# Patient Record
Sex: Female | Born: 1955 | Race: Black or African American | Hispanic: No | Marital: Married | Smoking: Never smoker
Health system: Southern US, Community
[De-identification: ages and names within clinical notes are randomized; demographics above are authoritative.]

## PROBLEM LIST (undated history)

## (undated) DIAGNOSIS — E039 Hypothyroidism, unspecified: Secondary | ICD-10-CM

## (undated) DIAGNOSIS — I10 Essential (primary) hypertension: Secondary | ICD-10-CM

## (undated) HISTORY — PX: TUBAL LIGATION: SHX77

## (undated) HISTORY — PX: ABDOMINAL HYSTERECTOMY: SHX81

---

## 1998-01-11 ENCOUNTER — Emergency Department (HOSPITAL_COMMUNITY): Admission: EM | Admit: 1998-01-11 | Discharge: 1998-01-11 | Payer: Self-pay | Admitting: Emergency Medicine

## 1998-01-12 ENCOUNTER — Encounter: Payer: Self-pay | Admitting: Emergency Medicine

## 2002-06-17 ENCOUNTER — Inpatient Hospital Stay (HOSPITAL_COMMUNITY): Admission: EM | Admit: 2002-06-17 | Discharge: 2002-06-21 | Payer: Self-pay | Admitting: Psychiatry

## 2009-07-13 ENCOUNTER — Ambulatory Visit (HOSPITAL_COMMUNITY): Admission: RE | Admit: 2009-07-13 | Discharge: 2009-07-13 | Payer: Self-pay | Admitting: Internal Medicine

## 2010-05-06 LAB — CBC
HCT: 37.7 % (ref 36.0–46.0)
Hemoglobin: 12.2 g/dL (ref 12.0–15.0)
MCHC: 32.4 g/dL (ref 30.0–36.0)
MCV: 78.1 fL (ref 78.0–100.0)
Platelets: 309 10*3/uL (ref 150–400)
RBC: 4.83 MIL/uL (ref 3.87–5.11)
RDW: 21.5 % — ABNORMAL HIGH (ref 11.5–15.5)
WBC: 4.4 10*3/uL (ref 4.0–10.5)

## 2010-05-06 LAB — BASIC METABOLIC PANEL
BUN: 8 mg/dL (ref 6–23)
CO2: 31 mEq/L (ref 19–32)
Calcium: 9.9 mg/dL (ref 8.4–10.5)
Chloride: 104 mEq/L (ref 96–112)
Creatinine, Ser: 0.91 mg/dL (ref 0.4–1.2)
GFR calc Af Amer: >60 mL/min (ref >60)
GFR calc non Af Amer: >60 mL/min (ref >60)
Glucose, Bld: 94 mg/dL (ref 70–99)
Potassium: 3.9 mEq/L (ref 3.5–5.1)
Sodium: 140 mEq/L (ref 135–145)

## 2010-05-21 ENCOUNTER — Other Ambulatory Visit: Payer: Self-pay | Admitting: Gynecology

## 2010-05-21 DIAGNOSIS — R928 Other abnormal and inconclusive findings on diagnostic imaging of breast: Secondary | ICD-10-CM

## 2010-06-03 ENCOUNTER — Ambulatory Visit
Admission: RE | Admit: 2010-06-03 | Discharge: 2010-06-03 | Disposition: A | Discharge status: Home / Self Care | Payer: 59 | Source: Ambulatory Visit | Attending: Gynecology | Admitting: Gynecology

## 2010-06-03 DIAGNOSIS — R928 Other abnormal and inconclusive findings on diagnostic imaging of breast: Secondary | ICD-10-CM

## 2010-06-27 ENCOUNTER — Encounter (HOSPITAL_COMMUNITY): Payer: 59

## 2010-06-27 ENCOUNTER — Other Ambulatory Visit (HOSPITAL_COMMUNITY): Payer: Self-pay | Admitting: Obstetrics and Gynecology

## 2010-06-27 LAB — BASIC METABOLIC PANEL
Calcium: 10.8 mg/dL — ABNORMAL HIGH (ref 8.4–10.5)
GFR calc Af Amer: >60 mL/min (ref >60)
GFR calc non Af Amer: >60 mL/min (ref >60)
Sodium: 135 mEq/L (ref 135–145)

## 2010-06-27 LAB — CBC
MCHC: 32.9 g/dL (ref 30.0–36.0)
RDW: 13.9 % (ref 11.5–15.5)

## 2010-06-27 LAB — SURGICAL PCR SCREEN: MRSA, PCR: NEGATIVE

## 2010-07-04 ENCOUNTER — Ambulatory Visit (HOSPITAL_COMMUNITY)
Admission: RE | Admit: 2010-07-04 | Discharge: 2010-07-05 | Disposition: A | Discharge status: Home / Self Care | Payer: 59 | Source: Ambulatory Visit | Attending: Obstetrics and Gynecology | Admitting: Obstetrics and Gynecology

## 2010-07-04 ENCOUNTER — Other Ambulatory Visit (HOSPITAL_COMMUNITY): Payer: Self-pay | Admitting: Obstetrics and Gynecology

## 2010-07-04 DIAGNOSIS — I10 Essential (primary) hypertension: Secondary | ICD-10-CM | POA: Insufficient documentation

## 2010-07-04 DIAGNOSIS — N8000 Endometriosis of the uterus, unspecified: Secondary | ICD-10-CM | POA: Insufficient documentation

## 2010-07-04 DIAGNOSIS — K7689 Other specified diseases of liver: Secondary | ICD-10-CM | POA: Insufficient documentation

## 2010-07-04 DIAGNOSIS — Z01818 Encounter for other preprocedural examination: Secondary | ICD-10-CM | POA: Insufficient documentation

## 2010-07-04 DIAGNOSIS — N838 Other noninflammatory disorders of ovary, fallopian tube and broad ligament: Secondary | ICD-10-CM | POA: Insufficient documentation

## 2010-07-04 DIAGNOSIS — N920 Excessive and frequent menstruation with regular cycle: Secondary | ICD-10-CM | POA: Insufficient documentation

## 2010-07-04 DIAGNOSIS — N840 Polyp of corpus uteri: Secondary | ICD-10-CM | POA: Insufficient documentation

## 2010-07-04 DIAGNOSIS — D251 Intramural leiomyoma of uterus: Secondary | ICD-10-CM | POA: Insufficient documentation

## 2010-07-04 DIAGNOSIS — Z01812 Encounter for preprocedural laboratory examination: Secondary | ICD-10-CM | POA: Insufficient documentation

## 2010-07-04 DIAGNOSIS — E039 Hypothyroidism, unspecified: Secondary | ICD-10-CM | POA: Insufficient documentation

## 2010-07-04 LAB — TYPE AND SCREEN: ABO/RH(D): O POS

## 2010-07-05 LAB — CBC
HCT: 31.6 % — ABNORMAL LOW (ref 36.0–46.0)
RDW: 13.8 % (ref 11.5–15.5)
WBC: 10.7 10*3/uL — ABNORMAL HIGH (ref 4.0–10.5)

## 2010-07-05 NOTE — Discharge Summary (Signed)
NAME:  ANNER, [name] NO.:  [id number]   MEDICAL RECORD NO.:  [id number]                   PATIENT TYPE:  IPS   LOCATION:  0404                                 FACILITY[facility]  [facility]   PHYSICIAN:  Jeanice Lim, M.D.              DATE OF BIRTH:  1955/12/17   DATE OF ADMISSION:  06/17/2002  DATE OF DISCHARGE:  06/21/2002                                 DISCHARGE SUMMARY   IDENTIFYING DATA:  This is a 55 year old African-American female currently  separated from her husband of 20 years, complaining of depression, hearing  voices, and having suicidal ideations since her husband left two weeks ago.   ALLERGIES:  No known drug allergies.   PHYSICAL EXAMINATION:  Essentially within normal limits.  Neurologically  nonfocal.   ROUTINE ADMISSION LABS:  Essentially within normal limits.   MENTAL STATUS EXAM:  The patient was religiously preoccupied, uncooperative  initially, neatly dressed and groomed, brief answers, somewhat guarded,  irritable, and depressed, clearly suspicious.  Thought process mostly goal  directed.  Thought content difficult to evaluate.  Patient not fully  cooperative due to paranoia and possible thought blocking, and responding to  internal stimulus.  Cognition was grossly intact.  Judgment and insight were  fair to poor.   ADMITTING DIAGNOSES:   AXIS I:  Schizophrenia, undifferentiated type.   AXIS II:  None.   AXIS III:  Hypertension.   AXIS IV:  Severe problems with primary support group.   AXIS V:  30/70.   The patient was admitted with routine p.r.n. medications, underwent further  monitoring, was encouraged to participate in individual, group, and milieu  therapy.  The patient was resumed on medical medications and psychotropics,  and given Geodon I.M. for acute agitation and Geodon was then optimized as  well as Haldol deck dose was verified.  The patient was given K-Dur to  replace potassium and started on Zoloft for  residual depressive symptoms  versus negative symptoms.  The patient reported a positive response to  clinical intervention and her condition at discharge is improved.  Mood was  less depressed, affect brighter, thought process goal directed, thought  content negative for dangerous ideation.  The patient no longer was hearing  voices and reported motivation to be compliant with the aftercare plan.  The  patient was discharged on Haldol 2 mg q.h.s., hydrochlorothiazide 25 mg  q.a.m., Ativan 0.25 mg b.i.d., Geodon 80 mg b.i.d., K-Dur 40 mEq b.i.d. and  Zoloft 25 mg q.a.m.  The patient was discharged to follow up with Dr. Lang Snow  Thursday, May 6th, at 11 a.m. at [facility].    DISCHARGE DIAGNOSES:   AXIS I:  Schizophrenia, undifferentiated type.   AXIS II:  None.   AXIS III:  Hypertension.   AXIS IV:  Severe problems with primary support group.   AXIS V:  55.  Jeanice Lim, M.D.    JEM/MEDQ  D:  07/11/2002  T:  07/11/2002  Job:  [id number]

## 2010-07-05 NOTE — H&P (Signed)
NAME:  Yvette Walker, Yvette Walker                              ACCOUNT NO.:  [id number]   MEDICAL RECORD NO.:  [id number]                   PATIENT TYPE:  IPS   LOCATION:  0404                                 [facility]ACILITY[facility]   PHYSICIAN:  Jeanice Lim, M.D.              DATE OF BIRTH:  03-Nov-1955   DATE OF ADMISSION:  06/17/2002  DATE OF DISCHARGE:                         PSYCHIATRIC ADMISSION ASSESSMENT   IDENTIFYING INFORMATION:  She is here on a voluntary basis.  Identifying  information is the patient and the records.  This  is a 55 year old African-  American female who is currently separating from her husband of 20 years.   REASON FOR ADMISSION AND SYMPTOMS:  This patient presented at [facility] yesterday complaining of depression, decreased sleep,  hearing whispers, and having suicidal ideation since her husband left 2  weeks ago.  She wanted to be admitted because she wanted to rest.   PAST PSYCHIATRIC HISTORY:  The patient has had 1 prior psychiatric admission  10 or 12 years ago at [facility].  She at that time was  responding to internal stimuli.  She felt that there was a person inside of  her that had to be cut out.  She has been maintained for years on Haldol  Decanoate, 50 mg per cc, 0.2 cc IM q.4 weeks.  Her last injection was April  4.   SOCIAL HISTORY:  She has been married for 20 years.  She has 1 daughter.  She cannot tell me how old her daughter is at this time and she reports that  her husband separated about 2 weeks ago from her.   FAMILY HISTORY:  She denies any schizophrenia.   ALCOHOL AND DRUG ABUSE:  She states she has no use of either alcohol or  tobacco.   PAST MEDICAL HISTORY:  She cannot tell me who her primary care Ilse Billman is,  however she is known to take hydrochlorothiazide for hypertension, 24 mg  p.o. daily.   ALLERGIES:  No known drug allergies.   POSITIVE PHYSICAL FINDINGS:  Her physical examination was within  normal  limits.  Her labs were not available at this time.  Her review of systems  was negative.   MENTAL STATUS EXAM:  Initially, the patient was too psychotic.  She was  religiously preoccupied.  On return visit the patient was noted to be neatly  groomed and dressed.  She has a normal weight.  She answers briefly but  appropriately to questioning.  She is not initiating conversation yet.  Her  mood is irritable.  She is depressed.  She is somewhat suspicious.  Her  thought processes are clear at the second visit.  She was alert and  oriented, however her memory was decreased.  Her concentration was  decreased.  Her judgment and insight were decreased.  She was thought  blocking and still  responding to internal stimuli.  Her cognition is  average.   ADMISSION DIAGNOSES:   AXIS I:  Schizophrenia, undifferentiated.   AXIS II:  None.   AXIS III:  Hypertension.   AXIS IV:  Severe.  Problems with primary support group.   AXIS V:  Global assessment of function is currently 30, in the past year  probably 70.   PLAN:  Admit to stabilize.  To get her back to her preadmission status, and  we will coordinate with the family to provide for the support during stay.       Vic Ripper, P.A.-C.               Jeanice Lim, M.D.    MD/MEDQ  D:  06/18/2002  T:  06/18/2002  Job:  [id number]

## 2010-07-12 NOTE — Op Note (Signed)
NAME:  Yvette Walker, Yvette Walker                  ACCOUNT NO.:  [id number]  MEDICAL RECORD NO.:  [id number]           PATIENT TYPE:  O  LOCATION:  9318                          FACILITY:  [facility]  PHYSICIAN:  Zelphia Cairo, MD    DATE OF BIRTH:  1955/03/29  DATE OF PROCEDURE:  07/04/2010 DATE OF DISCHARGE:                              OPERATIVE REPORT   PREOPERATIVE DIAGNOSES: 1. Endometrial mass. 2. History of endometrial hyperplasia.  POSTOPERATIVE DIAGNOSES: 1. Endometrial mass. 2. History of endometrial hyperplasia. 3. Liver lesion.  PROCEDURE: 1. Laparoscopic-assisted vaginal hysterectomy. 2. Bilateral salpingo-oophorectomy.  SURGEON:  Zelphia Cairo, MD.  ASSISTANTMarcelle Overlie.  BLOOD LOSS:  250 mL.  URINE OUTPUT:  300 mL, clear.  SPECIMEN:  Uterus, cervix, bilateral fallopian tubes, and ovaries.  COMPLICATIONS:  None.  CONDITION:  Stable and extubated to recovery room.  DESCRIPTION OF PROCEDURE:  [name] was taken to the operating room.  After informed consent was obtained, she was given general anesthesia and placed in the dorsal lithotomy position using Allen stirrups.  She was prepped and draped in sterile fashion and a Foley catheter was inserted. Bivalve speculum was placed in the vagina and a single-tooth tenaculum placed on the anterior lip of the cervix.  A Hulka clamp was placed on the cervix.  Single-tooth tenaculum and speculum were removed and our attention was turned to the abdomen.  A 0.25% Marcaine was used to provide local anesthesia at the site of our skin incision, a small infraumbilical skin incision was made using a prior incision from her tubal ligation.  This incision was extended bluntly to the level of the fascia using a Kelly clamp and the optical trocar was inserted under direct visualization.  Once intraperitoneal placement was confirmed, CO2 was turned on and the abdomen and pelvis were insufflated.  A survey of the abdomen and pelvis revealed an  enlarged uterus.  Bilateral ovaries appeared to have a discolored yellow appearance almost as if they were replaced by fatty tissue.  No excrescences were noted.  Fallopian tubes were consistent with prior tubal ligation.  She also had a small lesion on the right lobe of her liver.  No other intra-abdominal abnormalities were noted.  First, a suprapubic incision was made and a 5-mm trocar was inserted under direct visualization.  Atraumatic graspers were inserted through this trocar.  The uterus was manipulated to the patient's left side.  The right infundibulopelvic ligament was cauterized and cut using the instill device.  This incision was continued down the mesosalpinx just adjacent to the ovary and fallopian tube to the level of the round ligament.  The round ligament was then cauterized and cut using the instill device. This procedure was repeated on the opposite side.  Hemostasis was assured bilaterally and our attention was turned to the vagina.  All instruments were removed from the trocars in the abdomen.  CO2 was turned off. The Hulka clamp was removed from the cervix and a weighted speculum was placed in the vagina.  A Beaver was placed anteriorly and the cervix was grasped with a toothed tenaculum.  A circumferential incision  was made around the cervix using the Bovie.  The posterior cul-de-sac was entered sharply using curved Mayo scissors.  A weighted speculum was placed in the posterior cul-de-sac.  Bilateral uterosacral ligaments were clamped with Heaney clamps, transected and suture ligated.  Hemostasis was assured.  The anterior cul-de-sac was then entered sharply and a Christain Sacramento was placed anteriorly.  Cardinal ligaments were then clamped bilaterally, transected and suture ligated.  Uterine arteries and broad ligament were then serially clamped and with Heaney clamps, transected and suture ligated.  Once hemostasis was visualized, the uterus, bilateral tubes and ovaries  were delivered through the posterior cul-de- sac.  The cornua and remaining pedicles were clamped with Heaney clamps and transected.  These pedicles were suture ligated with excellent hemostasis.  Specimen was passed off to be sent to pathology.  The posterior vaginal cuff was then reapproximated to the peritoneum using a long running stitch of Monocryl.  Vaginal cuff angles were then closed with figure-of-eight stitches of #0 Vicryl.  The remainder of the vaginal cuff was then closed with figure-of-eight stitches of #0 Vicryl in an interrupted fashion.  All instruments were then removed from the vagina and our attention was returned to the abdomen.  The abdomen was reinsufflated.  All pedicles were inspected and found to be hemostatic. Vaginal cuff was inspected and found to be hemostatic.  All instruments and trocar were then removed from the abdomen.  A deep stitch was placed in the infraumbilical skin incision.  The skin was reapproximated using Vicryl and Dermabond was placed over the skin incisions.  The patient tolerated the procedure well.  Sponge lap, needle, and instrument counts were correct x2.  She was taken to the recovery room in stable condition.     Zelphia Cairo, MD     GA/MEDQ  D:  07/04/2010  T:  07/04/2010  Job:  [id number]  Electronically Signed by Zelphia Cairo MD on 07/12/2010 11:11:30 AM

## 2011-06-11 ENCOUNTER — Other Ambulatory Visit: Payer: Self-pay | Admitting: Gynecology

## 2011-06-11 DIAGNOSIS — R928 Other abnormal and inconclusive findings on diagnostic imaging of breast: Secondary | ICD-10-CM

## 2014-06-20 ENCOUNTER — Other Ambulatory Visit: Payer: Self-pay | Admitting: Gynecology

## 2014-06-22 LAB — CYTOLOGY - PAP

## 2016-07-20 DIAGNOSIS — F209 Schizophrenia, unspecified: Secondary | ICD-10-CM | POA: Insufficient documentation

## 2016-07-21 DIAGNOSIS — I10 Essential (primary) hypertension: Secondary | ICD-10-CM | POA: Insufficient documentation

## 2016-07-21 DIAGNOSIS — R7303 Prediabetes: Secondary | ICD-10-CM | POA: Insufficient documentation

## 2016-07-21 DIAGNOSIS — E039 Hypothyroidism, unspecified: Secondary | ICD-10-CM | POA: Insufficient documentation

## 2016-07-28 DIAGNOSIS — R9431 Abnormal electrocardiogram [ECG] [EKG]: Secondary | ICD-10-CM | POA: Insufficient documentation

## 2017-10-12 DIAGNOSIS — R000 Tachycardia, unspecified: Secondary | ICD-10-CM | POA: Diagnosis not present

## 2017-10-12 DIAGNOSIS — I10 Essential (primary) hypertension: Secondary | ICD-10-CM | POA: Diagnosis not present

## 2017-10-12 DIAGNOSIS — Z1231 Encounter for screening mammogram for malignant neoplasm of breast: Secondary | ICD-10-CM | POA: Diagnosis not present

## 2018-04-19 DIAGNOSIS — Z1231 Encounter for screening mammogram for malignant neoplasm of breast: Secondary | ICD-10-CM | POA: Diagnosis not present

## 2018-04-19 DIAGNOSIS — M8580 Other specified disorders of bone density and structure, unspecified site: Secondary | ICD-10-CM | POA: Diagnosis not present

## 2018-04-19 DIAGNOSIS — Z23 Encounter for immunization: Secondary | ICD-10-CM | POA: Diagnosis not present

## 2018-04-19 DIAGNOSIS — R000 Tachycardia, unspecified: Secondary | ICD-10-CM | POA: Diagnosis not present

## 2018-04-19 DIAGNOSIS — I10 Essential (primary) hypertension: Secondary | ICD-10-CM | POA: Diagnosis not present

## 2018-09-29 DIAGNOSIS — I10 Essential (primary) hypertension: Secondary | ICD-10-CM | POA: Diagnosis not present

## 2018-09-29 DIAGNOSIS — L820 Inflamed seborrheic keratosis: Secondary | ICD-10-CM | POA: Diagnosis not present

## 2018-09-29 DIAGNOSIS — R000 Tachycardia, unspecified: Secondary | ICD-10-CM | POA: Diagnosis not present

## 2018-09-29 DIAGNOSIS — G250 Essential tremor: Secondary | ICD-10-CM | POA: Diagnosis not present

## 2018-11-01 DIAGNOSIS — M8580 Other specified disorders of bone density and structure, unspecified site: Secondary | ICD-10-CM | POA: Diagnosis not present

## 2018-11-01 DIAGNOSIS — Z23 Encounter for immunization: Secondary | ICD-10-CM | POA: Diagnosis not present

## 2018-11-01 DIAGNOSIS — I10 Essential (primary) hypertension: Secondary | ICD-10-CM | POA: Diagnosis not present

## 2018-11-01 DIAGNOSIS — Z1231 Encounter for screening mammogram for malignant neoplasm of breast: Secondary | ICD-10-CM | POA: Diagnosis not present

## 2018-11-01 DIAGNOSIS — R000 Tachycardia, unspecified: Secondary | ICD-10-CM | POA: Diagnosis not present

## 2019-04-01 ENCOUNTER — Other Ambulatory Visit: Payer: Self-pay | Admitting: Physician Assistant

## 2019-04-01 DIAGNOSIS — M545 Low back pain: Secondary | ICD-10-CM | POA: Diagnosis not present

## 2019-04-01 DIAGNOSIS — Z1231 Encounter for screening mammogram for malignant neoplasm of breast: Secondary | ICD-10-CM

## 2019-04-01 DIAGNOSIS — G250 Essential tremor: Secondary | ICD-10-CM | POA: Diagnosis not present

## 2019-04-01 DIAGNOSIS — I10 Essential (primary) hypertension: Secondary | ICD-10-CM | POA: Diagnosis not present

## 2019-04-01 DIAGNOSIS — Z6824 Body mass index (BMI) 24.0-24.9, adult: Secondary | ICD-10-CM | POA: Diagnosis not present

## 2019-08-15 DIAGNOSIS — Z6823 Body mass index (BMI) 23.0-23.9, adult: Secondary | ICD-10-CM | POA: Diagnosis not present

## 2019-08-15 DIAGNOSIS — K625 Hemorrhage of anus and rectum: Secondary | ICD-10-CM | POA: Diagnosis not present

## 2019-08-15 DIAGNOSIS — R110 Nausea: Secondary | ICD-10-CM | POA: Diagnosis not present

## 2019-09-29 DIAGNOSIS — M8580 Other specified disorders of bone density and structure, unspecified site: Secondary | ICD-10-CM | POA: Diagnosis not present

## 2019-09-29 DIAGNOSIS — I10 Essential (primary) hypertension: Secondary | ICD-10-CM | POA: Diagnosis not present

## 2019-09-29 DIAGNOSIS — K625 Hemorrhage of anus and rectum: Secondary | ICD-10-CM | POA: Diagnosis not present

## 2019-09-29 DIAGNOSIS — Z1231 Encounter for screening mammogram for malignant neoplasm of breast: Secondary | ICD-10-CM | POA: Diagnosis not present

## 2019-09-30 ENCOUNTER — Other Ambulatory Visit: Payer: Self-pay | Admitting: Physician Assistant

## 2019-10-03 ENCOUNTER — Other Ambulatory Visit: Payer: Self-pay | Admitting: Physician Assistant

## 2019-10-03 DIAGNOSIS — M8580 Other specified disorders of bone density and structure, unspecified site: Secondary | ICD-10-CM

## 2019-11-08 DIAGNOSIS — K5900 Constipation, unspecified: Secondary | ICD-10-CM | POA: Diagnosis not present

## 2019-11-08 DIAGNOSIS — Z86010 Personal history of colonic polyps: Secondary | ICD-10-CM | POA: Diagnosis not present

## 2019-11-08 DIAGNOSIS — K625 Hemorrhage of anus and rectum: Secondary | ICD-10-CM | POA: Diagnosis not present

## 2019-12-27 DIAGNOSIS — Z1211 Encounter for screening for malignant neoplasm of colon: Secondary | ICD-10-CM | POA: Diagnosis not present

## 2019-12-27 DIAGNOSIS — Z86010 Personal history of colonic polyps: Secondary | ICD-10-CM | POA: Diagnosis not present

## 2019-12-27 DIAGNOSIS — K625 Hemorrhage of anus and rectum: Secondary | ICD-10-CM | POA: Diagnosis not present

## 2019-12-27 DIAGNOSIS — K5900 Constipation, unspecified: Secondary | ICD-10-CM | POA: Diagnosis not present

## 2020-04-05 ENCOUNTER — Other Ambulatory Visit: Payer: Self-pay | Admitting: Physician Assistant

## 2020-04-05 DIAGNOSIS — M8580 Other specified disorders of bone density and structure, unspecified site: Secondary | ICD-10-CM

## 2020-04-06 ENCOUNTER — Other Ambulatory Visit: Payer: Self-pay | Admitting: Physician Assistant

## 2020-04-06 DIAGNOSIS — Z1231 Encounter for screening mammogram for malignant neoplasm of breast: Secondary | ICD-10-CM

## 2020-09-11 ENCOUNTER — Other Ambulatory Visit: Payer: Self-pay

## 2020-09-11 ENCOUNTER — Ambulatory Visit
Admission: RE | Admit: 2020-09-11 | Discharge: 2020-09-11 | Disposition: A | Discharge status: Home / Self Care | Payer: Medicare Other | Source: Ambulatory Visit | Attending: Physician Assistant | Admitting: Physician Assistant

## 2020-09-11 DIAGNOSIS — M8580 Other specified disorders of bone density and structure, unspecified site: Secondary | ICD-10-CM

## 2020-09-11 DIAGNOSIS — Z1231 Encounter for screening mammogram for malignant neoplasm of breast: Secondary | ICD-10-CM

## 2020-09-13 ENCOUNTER — Other Ambulatory Visit: Payer: Self-pay | Admitting: Physician Assistant

## 2020-09-13 DIAGNOSIS — R928 Other abnormal and inconclusive findings on diagnostic imaging of breast: Secondary | ICD-10-CM

## 2020-11-28 ENCOUNTER — Ambulatory Visit
Admission: RE | Admit: 2020-11-28 | Discharge: 2020-11-28 | Disposition: A | Discharge status: Home / Self Care | Payer: Medicare Other | Source: Ambulatory Visit | Attending: Physician Assistant | Admitting: Physician Assistant

## 2020-11-28 ENCOUNTER — Other Ambulatory Visit: Payer: Self-pay | Admitting: Physician Assistant

## 2020-11-28 ENCOUNTER — Other Ambulatory Visit: Payer: Self-pay

## 2020-11-28 DIAGNOSIS — R928 Other abnormal and inconclusive findings on diagnostic imaging of breast: Secondary | ICD-10-CM

## 2020-11-30 ENCOUNTER — Ambulatory Visit
Admission: RE | Admit: 2020-11-30 | Discharge: 2020-11-30 | Disposition: A | Discharge status: Home / Self Care | Payer: Medicare Other | Source: Ambulatory Visit | Attending: Physician Assistant | Admitting: Physician Assistant

## 2020-11-30 ENCOUNTER — Other Ambulatory Visit: Payer: Self-pay

## 2020-11-30 DIAGNOSIS — C50919 Malignant neoplasm of unspecified site of unspecified female breast: Secondary | ICD-10-CM

## 2020-11-30 DIAGNOSIS — R928 Other abnormal and inconclusive findings on diagnostic imaging of breast: Secondary | ICD-10-CM

## 2020-11-30 HISTORY — DX: Malignant neoplasm of unspecified site of unspecified female breast: C50.919

## 2020-12-05 ENCOUNTER — Ambulatory Visit: Payer: Self-pay | Admitting: General Surgery

## 2020-12-05 DIAGNOSIS — Z170 Estrogen receptor positive status [ER+]: Secondary | ICD-10-CM

## 2020-12-05 DIAGNOSIS — C50212 Malignant neoplasm of upper-inner quadrant of left female breast: Secondary | ICD-10-CM

## 2020-12-06 ENCOUNTER — Other Ambulatory Visit: Payer: Self-pay | Admitting: General Surgery

## 2020-12-06 DIAGNOSIS — Z170 Estrogen receptor positive status [ER+]: Secondary | ICD-10-CM

## 2020-12-10 NOTE — Progress Notes (Signed)
New Breast Cancer Diagnosis: Left Breast UIQ  Did patient present with symptoms (if so, please note symptoms) or screening mammography?:Screening Mass    Location and Extent of disease :left breast. Located in the upper inner quadrant, measured  9 mm in greatest dimension. Adenopathy no.  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 Marlou Starks - I have discussed with her in detail the different options for treatment and at this point she favors breast conservation which I feel is very reasonable. -She will be a good candidate for sentinel node biopsy as well. -I will go ahead and refer her to medical and radiation oncology to discuss adjuvant therapy. -I will also refer her to physical therapy for preoperative lymphedema testing. -Left Breast lumpectomy with radioactive seed and SLN biopsy 01/23/2021   Medical oncologist, treatment if any:    Family History of Breast/Ovarian/Prostate Cancer: N/a  Lymphedema issues, if any: No     Pain issues, if any: has some mild tenderness  SAFETY ISSUES: Prior radiation? No Pacemaker/ICD? No Possible current pregnancy? Postmenopausal Is the patient on methotrexate? No  Current Complaints / other details:

## 2020-12-11 ENCOUNTER — Ambulatory Visit
Admission: RE | Admit: 2020-12-11 | Discharge: 2020-12-11 | Disposition: A | Discharge status: Home / Self Care | Payer: Medicare Other | Source: Ambulatory Visit | Attending: Radiation Oncology | Admitting: Radiation Oncology

## 2020-12-11 ENCOUNTER — Other Ambulatory Visit: Payer: Self-pay

## 2020-12-11 ENCOUNTER — Encounter: Payer: Self-pay | Admitting: Radiation Oncology

## 2020-12-11 DIAGNOSIS — Z170 Estrogen receptor positive status [ER+]: Secondary | ICD-10-CM

## 2020-12-11 DIAGNOSIS — C50212 Malignant neoplasm of upper-inner quadrant of left female breast: Secondary | ICD-10-CM | POA: Insufficient documentation

## 2020-12-11 NOTE — Progress Notes (Signed)
Radiation Oncology         [phone]) [phone] ________________________________  Initial Outpatient Consultation - Conducted via telephone due to current COVID-19 concerns for limiting patient exposure  I spoke with the patient to conduct this consult visit via telephone to spare the patient unnecessary potential exposure in the healthcare setting during the current COVID-19 pandemic. The patient was notified in advance and was offered a [place] meeting to allow for face to face communication but unfortunately reported that they did not have the appropriate resources/technology to support such a visit and instead preferred to proceed with a telephone consult.    Name: Yvette Walker        MRN: [id number]  Date of Service: 12/11/2020 DOB: [birth year]  BE:[id number], Salli Quarry, MD     REFERRING PHYSICIAN: Autumn Messing III, MD   DIAGNOSIS: The encounter diagnosis was Malignant neoplasm of upper-inner quadrant of left breast in female, estrogen receptor positive ([place]).   HISTORY OF PRESENT ILLNESS: Yvette Walker is a 65 y.o. female seen at the request of Dr. Marlou Starks for a new diagnosis of left breast cancer.  The patient was found to have screening detected mass in the left breast.  The right breast did not show any abnormalities.  She underwent further diagnostic imaging which revealed a mass in the posterior left breast at the 11 o'clock position measuring 9 mm in greatest dimension the axilla was negative for adenopathy.  A biopsy on 11/30/2020 revealed a grade 2 invasive ductal carcinoma that was ER/PR positive, HER2 was negative with a Ki-67 of 5%.  She plans to undergo left lumpectomy with sentinel lymph node biopsy on 01/23/21 and needs to meet with medical oncology.   PREVIOUS RADIATION THERAPY: No   PAST MEDICAL HISTORY:  Past Medical History:  Diagnosis Date   Breast cancer ([place]) 11/30/2020       PAST SURGICAL HISTORY:History reviewed. No pertinent surgical history.   FAMILY  HISTORY:  Family History  Problem Relation Age of Onset   Cervical cancer Sister      SOCIAL HISTORY:  reports that she has never smoked. She has never used smokeless tobacco. She reports that she does not drink alcohol. The patient is married and lives in [place], [place]. She's retired from working in [employer] setting.    ALLERGIES: Patient has no allergy information on record.   MEDICATIONS:  Current Outpatient Medications  Medication Sig Dispense Refill   alendronate (FOSAMAX) 70 MG tablet Take 70 mg by mouth once a week.     amLODipine (NORVASC) 10 MG tablet daily.     haloperidol (HALDOL) 20 MG tablet Take by mouth.     levothyroxine (SYNTHROID) 25 MCG tablet Take by mouth.     metoprolol succinate (TOPROL-XL) 25 MG 24 hr tablet Take 25 mg by mouth daily.     trihexyphenidyl (ARTANE) 2 MG tablet Take 2 mg by mouth daily as needed.     No current facility-administered medications for this encounter.     REVIEW OF SYSTEMS: On review of systems, the patient reports that she is doing well overall. She denies any concerns with her breast at this time. No other complaints are noted.     PHYSICAL EXAM:  Unable to assess due to encounter type.      ECOG = 0  0 - Asymptomatic (Fully active, able to carry on all predisease activities without restriction)  1 - Symptomatic but completely ambulatory (Restricted in physically strenuous activity but  ambulatory and able to carry out work of a light or sedentary nature. For example, light housework, office work)  2 - Symptomatic, <50% in bed during the day (Ambulatory and capable of all self care but unable to carry out any work activities. Up and about more than 50% of waking hours)  3 - Symptomatic, >50% in bed, but not bedbound (Capable of only limited self-care, confined to bed or chair 50% or more of waking hours)  4 - Bedbound (Completely disabled. Cannot carry on any self-care. Totally confined to bed or chair)  5 - Death    Eustace Pen MM, Creech RH, Tormey DC, et al. [phone]). "Toxicity and response criteria of the [facility]". [place] Oncol. 5 (6): 649-55    LABORATORY DATA:  Lab Results  Component Value Date   WBC 10.7 (H) 07/05/2010   HGB 10.1 (L) 07/05/2010   HCT 31.6 (L) 07/05/2010   MCV 80.4 07/05/2010   PLT 278 07/05/2010   Lab Results  Component Value Date   NA 135 06/27/2010   K 3.8 06/27/2010   CL 95 (L) 06/27/2010   CO2 29 06/27/2010   No results found for: ALT, AST, GGT, ALKPHOS, BILITOT    RADIOGRAPHY: US BREAST LTD UNI LEFT INC AXILLA  Result Date: 11/28/2020 CLINICAL DATA:  Recall from screening mammography, possible mass involving the UPPER INNER QUADRANT of the LEFT breast at posterior depth. EXAM: DIGITAL DIAGNOSTIC UNILATERAL LEFT MAMMOGRAM WITH TOMOSYNTHESIS AND CAD; ULTRASOUND LEFT BREAST LIMITED TECHNIQUE: Left digital diagnostic mammography and breast tomosynthesis was performed. The images were evaluated with computer-aided detection.; Targeted ultrasound examination of the left breast was performed. COMPARISON:  Previous exams, including the most recent screening mammogram 09/11/2020. No prior ultrasound. ACR Breast Density Category c: The breast tissue is heterogeneously dense, which may obscure small masses. FINDINGS: Full field CC, MLO and mediolateral views and spot compression CC, MLO and mediolateral views of the area of concern obtained. Due to the far posterior location of the mass, none of the full field or spot compression images include the entire mass. The mass was not visible on the current full field CC view, but was likely partially imaged on the 09/11/2020 mammogram and is located the inner breast. The mass is more superiorly located on mediolateral view than on the MLO view, confirming its location in the inner breast, therefore [place]. There is associated architectural distortion. Targeted ultrasound is performed, demonstrating an  irregular hypoechoic mass with vague and angular margins at the 11 o'clock position 6 cm from nipple at posterior depth, measuring approximately 9 x 5 x 8 mm, demonstrating mixed posterior characteristics and no internal power Doppler flow, corresponding to the screening mammographic finding. Sonographic evaluation of the LEFT axilla demonstrates no pathologic lymphadenopathy. IMPRESSION: 1. Highly suspicious 9 mm mass involving the UPPER INNER QUADRANT of the LEFT breast at the 11 o'clock position 6 cm from the nipple. 2. No pathologic LEFT axillary lymphadenopathy. RECOMMENDATION: Ultrasound-guided core needle biopsy of the LEFT breast mass. The ultrasound core needle biopsy procedure was discussed with the patient and her questions were answered. She wishes to proceed with the biopsy which has been scheduled by the [facility] staff. I have discussed the findings and recommendations with the patient. BI-RADS CATEGORY  5: Highly suggestive of malignancy. Electronically Signed   By: Evangeline Dakin M.D.   On: 11/28/2020 10:55  MM DIAG BREAST TOMO UNI LEFT  Result Date: 11/28/2020 CLINICAL DATA:  Recall from screening  mammography, possible mass involving the UPPER INNER QUADRANT of the LEFT breast at posterior depth. EXAM: DIGITAL DIAGNOSTIC UNILATERAL LEFT MAMMOGRAM WITH TOMOSYNTHESIS AND CAD; ULTRASOUND LEFT BREAST LIMITED TECHNIQUE: Left digital diagnostic mammography and breast tomosynthesis was performed. The images were evaluated with computer-aided detection.; Targeted ultrasound examination of the left breast was performed. COMPARISON:  Previous exams, including the most recent screening mammogram 09/11/2020. No prior ultrasound. ACR Breast Density Category c: The breast tissue is heterogeneously dense, which may obscure small masses. FINDINGS: Full field CC, MLO and mediolateral views and spot compression CC, MLO and mediolateral views of the area of concern obtained. Due to the far posterior  location of the mass, none of the full field or spot compression images include the entire mass. The mass was not visible on the current full field CC view, but was likely partially imaged on the 09/11/2020 mammogram and is located the inner breast. The mass is more superiorly located on mediolateral view than on the MLO view, confirming its location in the inner breast, therefore [place]. There is associated architectural distortion. Targeted ultrasound is performed, demonstrating an irregular hypoechoic mass with vague and angular margins at the 11 o'clock position 6 cm from nipple at posterior depth, measuring approximately 9 x 5 x 8 mm, demonstrating mixed posterior characteristics and no internal power Doppler flow, corresponding to the screening mammographic finding. Sonographic evaluation of the LEFT axilla demonstrates no pathologic lymphadenopathy. IMPRESSION: 1. Highly suspicious 9 mm mass involving the UPPER INNER QUADRANT of the LEFT breast at the 11 o'clock position 6 cm from the nipple. 2. No pathologic LEFT axillary lymphadenopathy. RECOMMENDATION: Ultrasound-guided core needle biopsy of the LEFT breast mass. The ultrasound core needle biopsy procedure was discussed with the patient and her questions were answered. She wishes to proceed with the biopsy which has been scheduled by the [facility] staff. I have discussed the findings and recommendations with the patient. BI-RADS CATEGORY  5: Highly suggestive of malignancy. Electronically Signed   By: Evangeline Dakin M.D.   On: 11/28/2020 10:55  MM CLIP PLACEMENT LEFT  Result Date: 11/30/2020 CLINICAL DATA:  Patient with suspicious left breast mass status post biopsy. EXAM: 3D DIAGNOSTIC LEFT MAMMOGRAM POST ULTRASOUND BIOPSY COMPARISON:  Previous exam(s). FINDINGS: 3D Mammographic images were obtained following ultrasound guided biopsy of left breast mass 11 o'clock position. The biopsy marking clip is in expected position at the  site of biopsy. IMPRESSION: Appropriate positioning of the ribbon shaped biopsy marking clip at the site of biopsy in the left breast 11 o'clock position. Final Assessment: Post Procedure Mammograms for Marker Placement Electronically Signed   By: Lovey Newcomer M.D.   On: 11/30/2020 12:11  [place] LT BREAST BX W LOC DEV 1ST LESION IMG BX SPEC US GUIDE  Addendum Date: 12/04/2020   ADDENDUM REPORT: 12/04/2020 08:04 ADDENDUM: Pathology revealed GRADE II INVASIVE MAMMARY CARCINOMA the LEFT breast, 11:00 o'clock, ribbon clip. This was found to be concordant by Dr. Lovey Newcomer. Pathology results were discussed with the patient by telephone. The patient reported doing well after the biopsy with tenderness at the site. Post biopsy instructions and care were reviewed and questions were answered. The patient was encouraged to call [facility] for any additional concerns. Surgical consultation has been arranged with Dr. Autumn Messing at [facility] on December 05, 2020. Pathology results reported by Stacie Acres RN on 12/03/2020. Electronically Signed   By: Lovey Newcomer M.D.   On: 12/04/2020 08:04  Result Date: 12/04/2020 CLINICAL DATA:  Patient with suspicious left breast mass 11 o'clock position. EXAM: ULTRASOUND GUIDED LEFT BREAST CORE NEEDLE BIOPSY COMPARISON:  Previous exam(s). PROCEDURE: I met with the patient and we discussed the procedure of ultrasound-guided biopsy, including benefits and alternatives. We discussed the high likelihood of a successful procedure. We discussed the risks of the procedure, including infection, bleeding, tissue injury, clip migration, and inadequate sampling. Informed written consent was given. The usual time-out protocol was performed immediately prior to the procedure. Lesion quadrant: Upper inner quadrant Using sterile technique and 1% Lidocaine as local anesthetic, under direct ultrasound visualization, a 14 gauge spring-loaded device was used to  perform biopsy of left breast mass 11 o'clock position using a medial approach. At the conclusion of the procedure ribbon tissue marker clip was deployed into the biopsy cavity. Follow up 2 view mammogram was performed and dictated separately. IMPRESSION: Ultrasound guided biopsy of left breast mass. No apparent complications. Electronically Signed: By: Lovey Newcomer M.D. On: 11/30/2020 12:07      IMPRESSION/PLAN: 1. Stage IA, [id number] grade 2, ER/PR positive invasive ductal carcinoma of the left breast. Dr. Lisbeth Renshaw discusses the pathology findings and reviews the nature of early stage left breast disease. Dr. Lisbeth Renshaw agrees with her plans for breast conservation with lumpectomy with sentinel node biopsy. Depending on the size of the final tumor measurements rendered by pathology, the tumor may be tested for Oncotype Dx score to determine a role for systemic therapy. Provided that chemotherapy is not indicated, the patient's course would then be followed by external radiotherapy to the breast  to reduce risks of local recurrence followed by antiestrogen therapy. We discussed the risks, benefits, short, and long term effects of radiotherapy, as well as the curative intent, and the patient is interested in proceeding. Dr. Lisbeth Renshaw discusses the delivery and logistics of radiotherapy and anticipates a course of 4 or up to 6 1/2  weeks of radiotherapy to the left breast with deep inspiration breath hold technique. We will see her back a few weeks after surgery to discuss the simulation process and anticipate we starting radiotherapy about 4-6 weeks after surgery.     Given current concerns for patient exposure during the COVID-19 pandemic, this encounter was conducted via telephone.  The patient has provided two factor identification and has given verbal consent for this type of encounter and has been advised to only accept a meeting of this type in a secure network environment. The time spent during this encounter was  60 minutes including preparation, discussion, and coordination of the patient's care. The attendants for this meeting include Blenda Nicely, RN, Dr. Lisbeth Renshaw, Hayden Pedro  and Michele Mcalpine and her husband Demyah Smyre.  During the encounter,  Blenda Nicely, RN, Dr. Lisbeth Renshaw, and Hayden Pedro were located at [facility] Radiation Oncology Department.  Michele Mcalpine was located at home with her husband Lamira Borin.    The above documentation reflects my direct findings during this shared patient visit. Please see the separate note by Dr. Lisbeth Renshaw on this date for the remainder of the patient's plan of care.    Carola Rhine, [facility]    **Disclaimer: This note was dictated with voice recognition software. Similar sounding words can inadvertently be transcribed and this note may contain transcription errors which may not have been corrected upon publication of note.**

## 2020-12-12 ENCOUNTER — Ambulatory Visit: Payer: Medicare Other | Admitting: Radiation Oncology

## 2020-12-12 ENCOUNTER — Telehealth: Payer: Self-pay | Admitting: Hematology and Oncology

## 2020-12-12 ENCOUNTER — Ambulatory Visit: Payer: Medicare Other

## 2020-12-12 NOTE — Progress Notes (Signed)
[facility] CONSULT NOTE  Patient Care Team: Cyndi Bender, PA-C as PCP - General (Physician Assistant)  CHIEF COMPLAINTS/PURPOSE OF CONSULTATION:  Newly diagnosed breast cancer  HISTORY OF PRESENTING ILLNESS:  Yvette Walker 65 y.o. female is here because of recent diagnosis of invasive mammary carcinoma of the left breast. Screening mammogram on 09/11/2020 showed a possible mass in the left breast. Diagnostic mammogram and [place] on 11/28/2020 showed highly suspicious 9 mm mass involving the UIQ of the left breast at the 11 o'clock position 6 cm from the nipple. Biopsy on 11/30/2020 showed invasive mammary carcinoma, PR+(100%)/ER+(95%)/Her2-. She presents to the clinic today for initial evaluation and discussion of treatment options.   I reviewed her records extensively and collaborated the history with the patient.  SUMMARY OF ONCOLOGIC HISTORY: Oncology History  Malignant neoplasm of upper-inner quadrant of left breast in female, estrogen receptor positive ([place])  11/30/2020 Initial Diagnosis   Screening mammogram: a possible mass in the left breast. Diagnostic mammogram and [place]: highly suspicious 9 mm mass involving the UIQ of the left breast at the 11 o'clock position 6 cm from the nipple. Biopsy: Grade 2 IDC PR+(100%) ER+(95%) Her2-.     MEDICAL HISTORY:  Past Medical History:  Diagnosis Date   Breast cancer ([place]) 11/30/2020    SURGICAL HISTORY: No past surgical history on file.  SOCIAL HISTORY: Social History   Socioeconomic History   Marital status: Married    Spouse name: Not on file   Number of children: Not on file   Years of education: Not on file   Highest education level: Not on file  Occupational History   Not on file  Tobacco Use   Smoking status: Never   Smokeless tobacco: Never  Substance and Sexual Activity   Alcohol use: Never   Drug use: Not on file   Sexual activity: Not on file  Other Topics Concern   Not on file  Social History  Narrative   Not on file   Social Determinants of Health   Financial Resource Strain: Not on file  Food Insecurity: Not on file  Transportation Needs: Not on file  Physical Activity: Not on file  Stress: Not on file  Social Connections: Not on file  Intimate Partner Violence: Not At Risk   Fear of Current or Ex-Partner: No   Emotionally Abused: No   Physically Abused: No   Sexually Abused: No    FAMILY HISTORY: Family History  Problem Relation Age of Onset   Cervical cancer Sister     ALLERGIES:  has no allergies on file.  MEDICATIONS:  Current Outpatient Medications  Medication Sig Dispense Refill   alendronate (FOSAMAX) 70 MG tablet Take 70 mg by mouth once a week.     amLODipine (NORVASC) 10 MG tablet daily.     haloperidol (HALDOL) 20 MG tablet Take by mouth.     levothyroxine (SYNTHROID) 25 MCG tablet Take by mouth.     metoprolol succinate (TOPROL-XL) 25 MG 24 hr tablet Take 25 mg by mouth daily.     trihexyphenidyl (ARTANE) 2 MG tablet Take 2 mg by mouth daily as needed.     No current facility-administered medications for this visit.    REVIEW OF SYSTEMS:   Constitutional: Denies fevers, chills or abnormal night sweats Eyes: Denies blurriness of vision, double vision or watery eyes Ears, nose, mouth, throat, and face: Denies mucositis or sore throat Respiratory: Denies cough, dyspnea or wheezes Cardiovascular: Denies palpitation, chest discomfort or lower  extremity swelling Gastrointestinal:  Denies nausea, heartburn or change in bowel habits Skin: Denies abnormal skin rashes Lymphatics: Denies new lymphadenopathy or easy bruising Neurological:Denies numbness, tingling or new weaknesses Behavioral/Psych: Mood is stable, no new changes  Breast:  Denies any palpable lumps or discharge All other systems were reviewed with the patient and are negative.  PHYSICAL EXAMINATION: ECOG PERFORMANCE STATUS: 0 - Asymptomatic  Vitals:   12/13/20 1311  BP: 125/69   Pulse: 74  Resp: 18  Temp: 97.7 F (36.5 C)  SpO2: 98%   Filed Weights   12/13/20 1311  Weight: 125 lb 8 oz (56.9 kg)       LABORATORY DATA:  I have reviewed the data as listed Lab Results  Component Value Date   WBC 10.7 (H) 07/05/2010   HGB 10.1 (L) 07/05/2010   HCT 31.6 (L) 07/05/2010   MCV 80.4 07/05/2010   PLT 278 07/05/2010   Lab Results  Component Value Date   NA 135 06/27/2010   K 3.8 06/27/2010   CL 95 (L) 06/27/2010   CO2 29 06/27/2010    RADIOGRAPHIC STUDIES: I have personally reviewed the radiological reports and agreed with the findings in the report.  ASSESSMENT AND PLAN:  Malignant neoplasm of upper-inner quadrant of left breast in female, estrogen receptor positive ([place]) 11/30/2020: Screening mammogram: a possible mass in the left breast. Diagnostic mammogram and [place]: highly suspicious 9 mm mass involving the UIQ of the left breast at the 11 o'clock position 6 cm from the nipple. Biopsy: Grade 2 IDC PR+(100%) ER+(95%) Her2-.  Pathology and radiology counseling:Discussed with the patient, the details of pathology including the type of breast cancer,the clinical staging, the significance of ER, PR and HER-2/neu receptors and the implications for treatment. After reviewing the pathology in detail, we proceeded to discuss the different treatment options between surgery, radiation, chemotherapy, antiestrogen therapies.  Recommendations: 1. Breast conserving surgery followed by 2. Oncotype DX testing to determine if chemotherapy would be of any benefit followed by 3. Adjuvant radiation therapy followed by 4. Adjuvant antiestrogen therapy  Oncotype counseling: I discussed Oncotype DX test. I explained to the patient that this is a 21 gene panel to evaluate patient tumors DNA to calculate recurrence score. This would help determine whether patient has high risk or low risk breast cancer. She understands that if her tumor was found to be high risk, she would  benefit from systemic chemotherapy. If low risk, no need of chemotherapy.  Return to clinic after surgery to discuss final pathology report and then determine if Oncotype DX testing will need to be sent.    All questions were answered. The patient knows to call the clinic with any problems, questions or concerns.   Rulon Eisenmenger, MD, MPH 12/13/2020    I, Thana Ates, am acting as scribe for Nicholas Lose, MD.  I have reviewed the above documentation for accuracy and completeness, and I agree with the above.

## 2020-12-12 NOTE — Telephone Encounter (Signed)
Scheduled appt per 10/21 referral. Pt is aware of appt date and time.  

## 2020-12-13 ENCOUNTER — Telehealth: Payer: Self-pay | Admitting: *Deleted

## 2020-12-13 ENCOUNTER — Other Ambulatory Visit: Payer: Self-pay

## 2020-12-13 ENCOUNTER — Encounter: Payer: Self-pay | Admitting: *Deleted

## 2020-12-13 ENCOUNTER — Inpatient Hospital Stay: Payer: Medicare Other | Attending: Hematology and Oncology | Admitting: Hematology and Oncology

## 2020-12-13 DIAGNOSIS — C50212 Malignant neoplasm of upper-inner quadrant of left female breast: Secondary | ICD-10-CM | POA: Diagnosis not present

## 2020-12-13 DIAGNOSIS — Z808 Family history of malignant neoplasm of other organs or systems: Secondary | ICD-10-CM | POA: Insufficient documentation

## 2020-12-13 DIAGNOSIS — Z170 Estrogen receptor positive status [ER+]: Secondary | ICD-10-CM | POA: Insufficient documentation

## 2020-12-13 NOTE — Telephone Encounter (Signed)
Left vm providing navigation resources and contact information for questions or needs.

## 2020-12-13 NOTE — Assessment & Plan Note (Signed)
11/30/2020: Screening mammogram: a possible mass in the left breast. Diagnostic mammogram and [place]: highly suspicious 9 mm mass involving the UIQ of the left breast at the 11 o'clock position 6 cm from the nipple. Biopsy: Grade 2 IDC PR+(100%) ER+(95%) Her2-.  Pathology and radiology counseling:Discussed with the patient, the details of pathology including the type of breast cancer,the clinical staging, the significance of ER, PR and HER-2/neu receptors and the implications for treatment. After reviewing the pathology in detail, we proceeded to discuss the different treatment options between surgery, radiation, chemotherapy, antiestrogen therapies.  Recommendations: 1. Breast conserving surgery followed by 2. Oncotype DX testing to determine if chemotherapy would be of any benefit followed by 3. Adjuvant radiation therapy followed by 4. Adjuvant antiestrogen therapy  Oncotype counseling: I discussed Oncotype DX test. I explained to the patient that this is a 21 gene panel to evaluate patient tumors DNA to calculate recurrence score. This would help determine whether patient has high risk or low risk breast cancer. She understands that if her tumor was found to be high risk, she would benefit from systemic chemotherapy. If low risk, no need of chemotherapy.  Return to clinic after surgery to discuss final pathology report and then determine if Oncotype DX testing will need to be sent.

## 2021-01-07 ENCOUNTER — Other Ambulatory Visit: Payer: Self-pay

## 2021-01-07 ENCOUNTER — Ambulatory Visit: Payer: Medicare Other | Attending: General Surgery | Admitting: Physical Therapy

## 2021-01-07 DIAGNOSIS — Z170 Estrogen receptor positive status [ER+]: Secondary | ICD-10-CM | POA: Diagnosis present

## 2021-01-07 DIAGNOSIS — R293 Abnormal posture: Secondary | ICD-10-CM | POA: Diagnosis present

## 2021-01-07 DIAGNOSIS — C50912 Malignant neoplasm of unspecified site of left female breast: Secondary | ICD-10-CM | POA: Insufficient documentation

## 2021-01-07 NOTE — Patient Instructions (Signed)

## 2021-01-07 NOTE — Therapy (Signed)
[facility] @ [address], [place], [address] Phone: [phone]   Fax:  [phone]  Physical Therapy Evaluation  Patient Details  Name: Yvette Walker MRN: [id number] Date of Birth: 1955/03/18 Referring Provider (PT): Dr. Marlou Starks   Encounter Date: 01/07/2021   PT End of Session - 01/07/21 1050     Visit Number 1    Number of Visits 2    Date for PT Re-Evaluation 03/07/21   prolonged to allow for post op visit   PT Start Time 1000    PT Stop Time 1035    PT Time Calculation (min) 35 min    Activity Tolerance Patient tolerated treatment well    Behavior During Therapy [facility] for tasks assessed/performed             Past Medical History:  Diagnosis Date   Breast cancer ([place]) 11/30/2020    No past surgical history on file.  There were no vitals filed for this visit.    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 No/denies                [facility] PT Assessment - 01/07/21 0001       Assessment   Medical Diagnosis left brast cancer    Referring Provider (PT) Dr. Marlou Starks    Onset Date/Surgical Date 11/28/20    Hand Dominance Left      Precautions   Precautions Other (comment)    Precaution Comments to have surgery      Restrictions   Weight Bearing Restrictions No      Balance Screen   Has the patient fallen in the past 6 months No    Has the patient had a decrease in activity level because of a fear of falling?  No    Is the patient reluctant to leave their home because of a fear of falling?  No      Home Ecologist residence    Living Arrangements Spouse/significant other      Prior Function   Level of Independence Independent      Cognition   Overall Cognitive Status Within Functional Limits for  tasks assessed      Observation/Other Assessments   Observations Pt wa      Posture/Postural Control   Posture/Postural Control Postural limitations    Postural Limitations Rounded Shoulders;Forward head;Increased thoracic kyphosis      ROM / Strength   AROM / PROM / Strength AROM      AROM   Overall AROM  Within functional limits for tasks performed    AROM Assessment Site Shoulder    Right/Left Shoulder Right;Left    Right Shoulder Extension 45 Degrees    Right Shoulder Flexion 140 Degrees    Right Shoulder ABduction 165 Degrees    Right Shoulder Internal Rotation 25 Degrees    Right Shoulder External Rotation 90 Degrees    Left Shoulder Extension 43 Degrees    Left Shoulder Flexion 140 Degrees    Left Shoulder ABduction 160 Degrees    Left Shoulder Internal Rotation 20 Degrees    Left Shoulder External Rotation 90 Degrees               LYMPHEDEMA/ONCOLOGY QUESTIONNAIRE - 01/07/21 0001       Right Upper  Extremity Lymphedema   10 cm Proximal to Olecranon Process 26.5 cm    Olecranon Process 23.2 cm    15 cm Proximal to Ulnar Styloid Process 23 cm    Just Proximal to Ulnar Styloid Process 14.5 cm    Across Hand at [employer] 18.5 cm    At [place] of 2nd Digit 6 cm      Left Upper Extremity Lymphedema   10 cm Proximal to Olecranon Process 26 cm    Olecranon Process 23 cm    15 cm Proximal to Ulnar Styloid Process 23.5 cm    Just Proximal to Ulnar Styloid Process 15 cm    Across Hand at [employer] 19.3 cm    At [place] of 2nd Digit 6 cm             L-DEX FLOWSHEETS - 01/07/21 1000       L-DEX LYMPHEDEMA SCREENING   Measurement Type Unilateral    L-DEX MEASUREMENT EXTREMITY Upper Extremity    POSITION  Standing    DOMINANT SIDE Left    At Risk Side Left    BASELINE SCORE (UNILATERAL) 3.1                    Objective measurements completed on examination: See above findings.                      Breast Clinic Goals  - 01/07/21 1056       Patient will be able to verbalize understanding of pertinent lymphedema risk reduction practices relevant to her diagnosis specifically related to skin care.   Time 8    Period Weeks    Status On-going      Patient will be able to return demonstrate and/or verbalize understanding of the post-op home exercise program related to regaining shoulder range of motion.   Time 1    Period Days    Status Achieved      Patient will be able to verbalize understanding of the importance of attending the postoperative After Breast Cancer Class for further lymphedema risk reduction education and therapeutic exercise.   Time 8    Period Weeks    Status On-going                   Plan - 01/07/21 1051     Clinical Impression Statement Pt is seen for baseline measurements in anticipation of surgery for left breast cancer on 01/23/2021. She was instructed in post op exercise and the importance of deep breathing , walking and exercise were reinforced and pt understood.  The topic of lymphedema was introduced and pt understands whe will learn more about this at her post op visit.  Baseline measurements and SOZO taken    Personal Factors and Comorbidities Comorbidity 2    Comorbidities upcoming surgery , abnormal posture    Stability/Clinical Decision Making Stable/Uncomplicated    Clinical Decision Making Low    Rehab Potential Excellent    PT Frequency --   post op visit   PT Duration 8 weeks    PT Treatment/Interventions ADLs/Self Care Home Management;Patient/family education;Therapeutic exercise;Therapeutic activities;Orthotic Fit/Training    PT Next Visit Plan post op reassessment, recert as needed, Regular 3 month SOZO follow up    Consulted and Agree with Plan of Care Patient            Patient will follow up at outpatient cancer rehab 3-4 weeks following surgery.  If  the patient requires physical therapy at that time, a specific plan will be dictated and sent to  the referring physician for approval. The patient was educated today on appropriate basic range of motion exercises to begin post operatively and the importance of attending the After Breast Cancer class following surgery.  Patient was educated today on lymphedema risk reduction practices as it pertains to recommendations that will benefit the patient immediately following surgery.  She verbalized good understanding.    The patient was assessed using the L-Dex machine today to produce a lymphedema index baseline score. The patient will be reassessed on a regular basis (typically every 3 months) to obtain new L-Dex scores. If the score is > 6.5 points away from his/her baseline score indicating onset of subclinical lymphedema, it will be recommended to wear a compression garment for 4 weeks, 12 hours per day and then be reassessed. If the score continues to be > 6.5 points from baseline at reassessment, we will initiate lymphedema treatment. Assessing in this manner has a 95% rate of preventing clinically significant lymphedema.  Patient will benefit from skilled therapeutic intervention in order to improve the following deficits and impairments:  Decreased knowledge of precautions, Decreased knowledge of use of DME, Postural dysfunction  Visit Diagnosis: Malignant neoplasm of left breast in female, estrogen receptor positive, unspecified site of breast ([place]) - Plan: PT plan of care cert/re-cert  Abnormal posture - Plan: PT plan of care cert/re-cert     Problem List Patient Active Problem List   Diagnosis Date Noted   Malignant neoplasm of upper-inner quadrant of left breast in female, estrogen receptor positive ([place]) 12/11/2020   Donato Heinz. Owens Shark PT  Norwood Levo, PT 01/07/2021, 11:00 AM  [facility] @ [address] [place], [place], [address] Phone: [phone]   Fax:  [phone]  Name: Yvette Walker MRN: [id number] Date of  Birth: 07-Oct-1955

## 2021-01-15 ENCOUNTER — Other Ambulatory Visit: Payer: Self-pay

## 2021-01-15 ENCOUNTER — Encounter (HOSPITAL_BASED_OUTPATIENT_CLINIC_OR_DEPARTMENT_OTHER): Payer: Self-pay | Admitting: General Surgery

## 2021-01-18 ENCOUNTER — Encounter (HOSPITAL_BASED_OUTPATIENT_CLINIC_OR_DEPARTMENT_OTHER)
Admission: RE | Admit: 2021-01-18 | Discharge: 2021-01-18 | Disposition: A | Discharge status: Home / Self Care | Payer: Medicare Other | Source: Ambulatory Visit | Attending: General Surgery | Admitting: General Surgery

## 2021-01-18 DIAGNOSIS — Z01818 Encounter for other preprocedural examination: Secondary | ICD-10-CM | POA: Insufficient documentation

## 2021-01-18 DIAGNOSIS — I10 Essential (primary) hypertension: Secondary | ICD-10-CM | POA: Insufficient documentation

## 2021-01-18 LAB — BASIC METABOLIC PANEL
Anion gap: 9 (ref 5–15)
BUN: 10 mg/dL (ref 8–23)
CO2: 28 mmol/L (ref 22–32)
Calcium: 10.1 mg/dL (ref 8.9–10.3)
Chloride: 105 mmol/L (ref 98–111)
Creatinine, Ser: 1.05 mg/dL — ABNORMAL HIGH (ref 0.44–1.00)
GFR, Estimated: 59 mL/min — ABNORMAL LOW (ref >60)
Glucose, Bld: 91 mg/dL (ref 70–99)
Potassium: 5 mmol/L (ref 3.5–5.1)
Sodium: 142 mmol/L (ref 135–145)

## 2021-01-18 NOTE — Progress Notes (Signed)

## 2021-01-22 ENCOUNTER — Ambulatory Visit
Admission: RE | Admit: 2021-01-22 | Discharge: 2021-01-22 | Disposition: A | Discharge status: Home / Self Care | Payer: Medicare Other | Source: Ambulatory Visit | Attending: General Surgery | Admitting: General Surgery

## 2021-01-22 ENCOUNTER — Other Ambulatory Visit: Payer: Self-pay | Admitting: General Surgery

## 2021-01-22 DIAGNOSIS — C50212 Malignant neoplasm of upper-inner quadrant of left female breast: Secondary | ICD-10-CM

## 2021-01-22 DIAGNOSIS — Z170 Estrogen receptor positive status [ER+]: Secondary | ICD-10-CM

## 2021-01-23 ENCOUNTER — Encounter (HOSPITAL_BASED_OUTPATIENT_CLINIC_OR_DEPARTMENT_OTHER)
Admission: RE | Disposition: A | Discharge status: Home / Self Care | Payer: Self-pay | Source: Home / Self Care | Attending: General Surgery

## 2021-01-23 ENCOUNTER — Ambulatory Visit (HOSPITAL_BASED_OUTPATIENT_CLINIC_OR_DEPARTMENT_OTHER): Payer: Medicare Other | Admitting: Certified Registered Nurse Anesthetist

## 2021-01-23 ENCOUNTER — Ambulatory Visit
Admission: RE | Admit: 2021-01-23 | Discharge: 2021-01-23 | Disposition: A | Discharge status: Home / Self Care | Payer: Medicare Other | Source: Ambulatory Visit | Attending: General Surgery | Admitting: General Surgery

## 2021-01-23 ENCOUNTER — Ambulatory Visit (HOSPITAL_BASED_OUTPATIENT_CLINIC_OR_DEPARTMENT_OTHER)
Admission: RE | Admit: 2021-01-23 | Discharge: 2021-01-23 | Disposition: A | Discharge status: Home / Self Care | Payer: Medicare Other | Attending: General Surgery | Admitting: General Surgery

## 2021-01-23 ENCOUNTER — Encounter (HOSPITAL_BASED_OUTPATIENT_CLINIC_OR_DEPARTMENT_OTHER): Payer: Self-pay | Admitting: General Surgery

## 2021-01-23 DIAGNOSIS — C50212 Malignant neoplasm of upper-inner quadrant of left female breast: Secondary | ICD-10-CM

## 2021-01-23 DIAGNOSIS — E039 Hypothyroidism, unspecified: Secondary | ICD-10-CM | POA: Diagnosis not present

## 2021-01-23 DIAGNOSIS — I10 Essential (primary) hypertension: Secondary | ICD-10-CM | POA: Diagnosis not present

## 2021-01-23 DIAGNOSIS — Z170 Estrogen receptor positive status [ER+]: Secondary | ICD-10-CM

## 2021-01-23 HISTORY — DX: Essential (primary) hypertension: I10

## 2021-01-23 HISTORY — DX: Hypothyroidism, unspecified: E03.9

## 2021-01-23 HISTORY — PX: BREAST LUMPECTOMY WITH RADIOACTIVE SEED AND SENTINEL LYMPH NODE BIOPSY: SHX6550

## 2021-01-23 SURGERY — BREAST LUMPECTOMY WITH RADIOACTIVE SEED AND SENTINEL LYMPH NODE BIOPSY
Anesthesia: General | Site: Breast | Laterality: Left

## 2021-01-23 MED ORDER — LIDOCAINE HCL (CARDIAC) PF 100 MG/5ML IV SOSY
PREFILLED_SYRINGE | INTRAVENOUS | Status: DC | PRN
  Administered 2021-01-23: 50 mg via INTRAVENOUS

## 2021-01-23 MED ORDER — ACETAMINOPHEN 500 MG PO TABS
1000.0000 mg | ORAL_TABLET | ORAL | Status: AC
  Administered 2021-01-23: 1000 mg via ORAL

## 2021-01-23 MED ORDER — CHLORHEXIDINE GLUCONATE CLOTH 2 % EX PADS: 6.0000 | MEDICATED_PAD | Freq: Once | CUTANEOUS | Status: DC

## 2021-01-23 MED ORDER — HYDROCODONE-ACETAMINOPHEN 5-325 MG PO TABS: 1.0000 | ORAL_TABLET | Freq: Four times a day (QID) | ORAL | 0 refills | Status: DC | PRN

## 2021-01-23 MED ORDER — BUPIVACAINE-EPINEPHRINE 0.25% -1:200000 IJ SOLN
INTRAMUSCULAR | Status: DC | PRN
  Administered 2021-01-23: 15 mL
  Administered 2021-01-23: 5 mL

## 2021-01-23 MED ORDER — FENTANYL CITRATE (PF) 100 MCG/2ML IJ SOLN
50.0000 ug | Freq: Once | INTRAMUSCULAR | Status: AC
  Administered 2021-01-23: 50 ug via INTRAVENOUS

## 2021-01-23 MED ORDER — OXYCODONE HCL 5 MG/5ML PO SOLN: 5.0000 mg | Freq: Once | ORAL | Status: DC | PRN

## 2021-01-23 MED ORDER — ONDANSETRON HCL 4 MG/2ML IJ SOLN
INTRAMUSCULAR | Status: AC
  Filled 2021-01-23: qty 2

## 2021-01-23 MED ORDER — FENTANYL CITRATE (PF) 100 MCG/2ML IJ SOLN: 25.0000 ug | INTRAMUSCULAR | Status: DC | PRN

## 2021-01-23 MED ORDER — GABAPENTIN 100 MG PO CAPS
ORAL_CAPSULE | ORAL | Status: AC
  Filled 2021-01-23: qty 1

## 2021-01-23 MED ORDER — MIDAZOLAM HCL 2 MG/2ML IJ SOLN
1.0000 mg | Freq: Once | INTRAMUSCULAR | Status: AC
  Administered 2021-01-23: 1 mg via INTRAVENOUS

## 2021-01-23 MED ORDER — OXYCODONE HCL 5 MG PO TABS: 5.0000 mg | ORAL_TABLET | Freq: Once | ORAL | Status: DC | PRN

## 2021-01-23 MED ORDER — SODIUM CHLORIDE (PF) 0.9 % IJ SOLN
INTRAMUSCULAR | Status: AC
  Filled 2021-01-23: qty 10

## 2021-01-23 MED ORDER — PROPOFOL 10 MG/ML IV BOLUS
INTRAVENOUS | Status: AC
  Filled 2021-01-23: qty 20

## 2021-01-23 MED ORDER — CEFAZOLIN SODIUM-DEXTROSE 2-4 GM/100ML-% IV SOLN
INTRAVENOUS | Status: AC
  Filled 2021-01-23: qty 100

## 2021-01-23 MED ORDER — PHENYLEPHRINE HCL (PRESSORS) 10 MG/ML IV SOLN
INTRAVENOUS | Status: DC | PRN
  Administered 2021-01-23 (×4): 80 ug via INTRAVENOUS
  Administered 2021-01-23: 120 ug via INTRAVENOUS
  Administered 2021-01-23: 80 ug via INTRAVENOUS

## 2021-01-23 MED ORDER — CELECOXIB 100 MG PO CAPS
100.0000 mg | ORAL_CAPSULE | ORAL | Status: AC
  Administered 2021-01-23: 100 mg via ORAL

## 2021-01-23 MED ORDER — PROPOFOL 10 MG/ML IV BOLUS
INTRAVENOUS | Status: DC | PRN
  Administered 2021-01-23: 150 mg via INTRAVENOUS

## 2021-01-23 MED ORDER — LIDOCAINE 2% (20 MG/ML) 5 ML SYRINGE
INTRAMUSCULAR | Status: AC
  Filled 2021-01-23: qty 5

## 2021-01-23 MED ORDER — FENTANYL CITRATE (PF) 100 MCG/2ML IJ SOLN
INTRAMUSCULAR | Status: AC
  Filled 2021-01-23: qty 2

## 2021-01-23 MED ORDER — EPHEDRINE 5 MG/ML INJ
INTRAVENOUS | Status: AC
  Filled 2021-01-23: qty 5

## 2021-01-23 MED ORDER — METHYLENE BLUE 0.5 % INJ SOLN
INTRAVENOUS | Status: AC
  Filled 2021-01-23: qty 10

## 2021-01-23 MED ORDER — GLYCOPYRROLATE PF 0.2 MG/ML IJ SOSY
PREFILLED_SYRINGE | INTRAMUSCULAR | Status: AC
  Filled 2021-01-23: qty 1

## 2021-01-23 MED ORDER — BUPIVACAINE HCL (PF) 0.25 % IJ SOLN
INTRAMUSCULAR | Status: DC | PRN
  Administered 2021-01-23: 20 mL

## 2021-01-23 MED ORDER — MAGTRACE LYMPHATIC TRACER
INTRAMUSCULAR | Status: DC | PRN
  Administered 2021-01-23: 3 mL via INTRAMUSCULAR

## 2021-01-23 MED ORDER — DEXAMETHASONE SODIUM PHOSPHATE 10 MG/ML IJ SOLN
INTRAMUSCULAR | Status: AC
  Filled 2021-01-23: qty 1

## 2021-01-23 MED ORDER — ONDANSETRON HCL 4 MG/2ML IJ SOLN: 4.0000 mg | Freq: Once | INTRAMUSCULAR | Status: DC | PRN

## 2021-01-23 MED ORDER — ONDANSETRON HCL 4 MG/2ML IJ SOLN
INTRAMUSCULAR | Status: DC | PRN
  Administered 2021-01-23: 4 mg via INTRAVENOUS

## 2021-01-23 MED ORDER — MIDAZOLAM HCL 2 MG/2ML IJ SOLN
INTRAMUSCULAR | Status: AC
  Filled 2021-01-23: qty 2

## 2021-01-23 MED ORDER — GABAPENTIN 100 MG PO CAPS
100.0000 mg | ORAL_CAPSULE | ORAL | Status: AC
  Administered 2021-01-23: 100 mg via ORAL

## 2021-01-23 MED ORDER — CEFAZOLIN SODIUM-DEXTROSE 2-4 GM/100ML-% IV SOLN
2.0000 g | INTRAVENOUS | Status: AC
  Administered 2021-01-23: 2 g via INTRAVENOUS

## 2021-01-23 MED ORDER — GLYCOPYRROLATE 0.2 MG/ML IJ SOLN
INTRAMUSCULAR | Status: DC | PRN
  Administered 2021-01-23: .2 mg via INTRAVENOUS

## 2021-01-23 MED ORDER — LACTATED RINGERS IV SOLN: INTRAVENOUS | Status: DC

## 2021-01-23 MED ORDER — BUPIVACAINE LIPOSOME 1.3 % IJ SUSP
INTRAMUSCULAR | Status: DC | PRN
  Administered 2021-01-23: 10 mL

## 2021-01-23 MED ORDER — CELECOXIB 100 MG PO CAPS
ORAL_CAPSULE | ORAL | Status: AC
  Filled 2021-01-23: qty 1

## 2021-01-23 MED ORDER — BUPIVACAINE-EPINEPHRINE (PF) 0.25% -1:200000 IJ SOLN
INTRAMUSCULAR | Status: AC
  Filled 2021-01-23: qty 30

## 2021-01-23 MED ORDER — DEXAMETHASONE SODIUM PHOSPHATE 4 MG/ML IJ SOLN
INTRAMUSCULAR | Status: DC | PRN
  Administered 2021-01-23: 10 mg via INTRAVENOUS

## 2021-01-23 MED ORDER — ACETAMINOPHEN 500 MG PO TABS
ORAL_TABLET | ORAL | Status: AC
  Filled 2021-01-23: qty 2

## 2021-01-23 MED ORDER — EPHEDRINE SULFATE 50 MG/ML IJ SOLN
INTRAMUSCULAR | Status: DC | PRN
  Administered 2021-01-23 (×2): 10 mg via INTRAVENOUS
  Administered 2021-01-23: 5 mg via INTRAVENOUS

## 2021-01-23 SURGICAL SUPPLY — 44 items
ADH SKN CLS APL DERMABOND .7 (GAUZE/BANDAGES/DRESSINGS) ×1
APL PRP STRL LF DISP 70% ISPRP (MISCELLANEOUS) ×1
APPLIER CLIP 9.375 MED OPEN (MISCELLANEOUS) ×2
APR CLP MED 9.3 20 MLT OPN (MISCELLANEOUS) ×1
BLADE SURG 15 STRL LF DISP TIS (BLADE) ×1 IMPLANT
BLADE SURG 15 STRL SS (BLADE) ×2
CANISTER SUC SOCK COL 7IN (MISCELLANEOUS) IMPLANT
CANISTER SUCT 1200ML W/VALVE (MISCELLANEOUS) IMPLANT
CHLORAPREP W/TINT 26 (MISCELLANEOUS) ×2 IMPLANT
CLIP APPLIE 9.375 MED OPEN (MISCELLANEOUS) ×1 IMPLANT
COVER BACK TABLE 60X90IN (DRAPES) ×2 IMPLANT
COVER MAYO STAND STRL (DRAPES) ×2 IMPLANT
COVER PROBE W GEL 5X96 (DRAPES) ×4 IMPLANT
DECANTER SPIKE VIAL GLASS SM (MISCELLANEOUS) IMPLANT
DERMABOND ADVANCED (GAUZE/BANDAGES/DRESSINGS) ×1
DERMABOND ADVANCED .7 DNX12 (GAUZE/BANDAGES/DRESSINGS) ×1 IMPLANT
DRAPE LAPAROSCOPIC ABDOMINAL (DRAPES) ×2 IMPLANT
DRAPE UTILITY XL STRL (DRAPES) ×2 IMPLANT
ELECT COATED BLADE 2.86 ST (ELECTRODE) ×2 IMPLANT
ELECT REM PT RETURN 9FT ADLT (ELECTROSURGICAL) ×2
ELECTRODE REM PT RTRN 9FT ADLT (ELECTROSURGICAL) ×1 IMPLANT
GLOVE SURG ENC MOIS LTX SZ7.5 (GLOVE) ×2 IMPLANT
GOWN STRL REUS W/ TWL LRG LVL3 (GOWN DISPOSABLE) ×2 IMPLANT
GOWN STRL REUS W/TWL LRG LVL3 (GOWN DISPOSABLE) ×4
ILLUMINATOR WAVEGUIDE N/F (MISCELLANEOUS) IMPLANT
KIT MARKER MARGIN INK (KITS) ×2 IMPLANT
LIGHT WAVEGUIDE WIDE FLAT (MISCELLANEOUS) IMPLANT
NDL SAFETY ECLIPSE 18X1.5 (NEEDLE) IMPLANT
NEEDLE HYPO 18GX1.5 SHARP (NEEDLE)
NEEDLE HYPO 25X1 1.5 SAFETY (NEEDLE) ×2 IMPLANT
NS IRRIG 1000ML POUR BTL (IV SOLUTION) ×2 IMPLANT
PACK BASIN DAY SURGERY FS (CUSTOM PROCEDURE TRAY) ×2 IMPLANT
PENCIL SMOKE EVACUATOR (MISCELLANEOUS) ×2 IMPLANT
SLEEVE SCD COMPRESS KNEE MED (STOCKING) ×2 IMPLANT
SPONGE T-LAP 18X18 ~~LOC~~+RFID (SPONGE) ×2 IMPLANT
SUT MON AB 4-0 PC3 18 (SUTURE) ×4 IMPLANT
SUT SILK 2 0 SH (SUTURE) IMPLANT
SUT VICRYL 3-0 CR8 SH (SUTURE) ×2 IMPLANT
SYR CONTROL 10ML LL (SYRINGE) ×2 IMPLANT
TOWEL GREEN STERILE FF (TOWEL DISPOSABLE) ×2 IMPLANT
TRACER MAGTRACE VIAL (MISCELLANEOUS) ×2 IMPLANT
TRAY FAXITRON CT DISP (TRAY / TRAY PROCEDURE) ×2 IMPLANT
TUBE CONNECTING 20X1/4 (TUBING) IMPLANT
YANKAUER SUCT BULB TIP NO VENT (SUCTIONS) IMPLANT

## 2021-01-23 NOTE — Op Note (Signed)
01/23/2021  9:38 AM  PATIENT:  Yvette Walker  65 y.o. female  PRE-OPERATIVE DIAGNOSIS:  LEFT BREAST CANCER  POST-OPERATIVE DIAGNOSIS:  LEFT BREAST CANCER  PROCEDURE:  Procedure(s): LEFT BREAST LUMPECTOMY WITH RADIOACTIVE SEED LOCALIZATION AND DEEP LEFT AXILLARY SENTINEL LYMPH NODE BIOPSY (Left)  SURGEON:  Surgeon(s) and Role:    * Jovita Kussmaul, MD - Primary  PHYSICIAN ASSISTANT:   ASSISTANTS: none   ANESTHESIA:   local and general  EBL:  10 mL   BLOOD ADMINISTERED:none  DRAINS: none   LOCAL MEDICATIONS USED:  MARCAINE     SPECIMEN:  Source of Specimen:  left breast tissue and sentinel node  DISPOSITION OF SPECIMEN:  PATHOLOGY  COUNTS:  YES  TOURNIQUET:  * No tourniquets in log *  DICTATION: .Dragon Dictation  After informed consent was obtained the patient was brought to the operating room and placed in the supine position on the operating table.  After adequate induction of general anesthesia the patient's left chest, breast, and axillary area were prepped with ChloraPrep, allowed to dry, and draped in usual sterile manner.  An appropriate timeout was performed.  2 cc of iron oxide were then injected in the subareolar plexus of the left breast and the breast was massaged for 5 minutes.  Previously an I-125 seed was placed in the upper inner quadrant of the left breast to mark an area of invasive breast cancer.  The mag trace was used to identify a signal in the left axilla.  This area was infiltrated with quarter percent Marcaine.  A small transversely oriented incision was made with a 15 blade knife overlying the signal.  The incision was carried through the skin and subcutaneous tissue sharply with the electrocautery until the deep left axillary space was entered.  The mag trace was then used to identify a signal.  The lymph node was excised sharply with the electrocautery and the surrounding small vessels and lymphatics were controlled with clips.  There may have been 2  nodes and what was removed but this tissue was sent to pathology for further evaluation as sentinel node #1.  No other hot or palpable nodes were identified in the left axilla.  Hemostasis was achieved using the Bovie electrocautery.  The deep layer of the axilla was then closed with interrupted 3-0 Vicryl stitches.  The skin was then closed with a running 4-0 Monocryl subcuticular stitch.  Attention was then turned to the left breast.  The neoprobe was set to I-125 in the area of radioactivity was readily identified far in the upper inner quadrant.  Because of the distance from the nipple I elected to make an elliptical incision in the skin overlying the area of radioactivity with a 15 blade knife.  The incision was carried through the skin and subcutaneous tissue sharply with the electrocautery.  Dissection was then carried widely around the radioactive seed while checking the area of radioactivity frequently.  This dissection was carried all the way to the chest wall muscle.  Once the specimen was removed it was oriented with the appropriate paint colors.  A specimen radiograph was obtained that showed the clip and seed to be near the center of the specimen.  The specimen was then sent to pathology for further evaluation.  Hemostasis was achieved using the Bovie electrocautery.  The wound was irrigated with saline and infiltrated with more quarter percent Marcaine.  The deep layer of the wound was closed with interrupted 3-0 Vicryl stitches.  The skin  was closed with a running 4-0 Monocryl subcuticular stitch.  Dermabond dressings were applied.  The patient tolerated the procedure well.  At the end of the case all needle sponge and instrument counts were correct.  The patient was then awakened and taken recovery in stable condition.  PLAN OF CARE: Discharge to home after PACU  PATIENT DISPOSITION:  PACU - hemodynamically stable.   Delay start of Pharmacological VTE agent (>24hrs) due to surgical blood loss  or risk of bleeding: not applicable

## 2021-01-23 NOTE — Interval H&P Note (Signed)
History and Physical Interval Note:  01/23/2021 8:07 AM  Yvette Walker  has presented today for surgery, with the diagnosis of LEFT BREAST CANCER.  The various methods of treatment have been discussed with the patient and family. After consideration of risks, benefits and other options for treatment, the patient has consented to  Procedure(s): LEFT BREAST LUMPECTOMY WITH RADIOACTIVE SEED AND SENTINEL LYMPH NODE BIOPSY (Left) as a surgical intervention.  The patient's history has been reviewed, patient examined, no change in status, stable for surgery.  I have reviewed the patient's chart and labs.  Questions were answered to the patient's satisfaction.     Autumn Messing III

## 2021-01-23 NOTE — Anesthesia Preprocedure Evaluation (Addendum)
Anesthesia Evaluation  Patient identified by MRN, date of birth, ID band Patient awake    Reviewed: Allergy & Precautions, NPO status , Patient's Chart, lab work & pertinent test results, reviewed documented beta blocker date and time   History of Anesthesia Complications Negative for: history of anesthetic complications  Airway Mallampati: II  TM Distance: >3 FB Neck ROM: Full    Dental  (+) Dental Advisory Given, Missing   Pulmonary neg pulmonary ROS,    Pulmonary exam normal        Cardiovascular hypertension, Pt. on medications and Pt. on home beta blockers Normal cardiovascular exam     Neuro/Psych negative neurological ROS  negative psych ROS   GI/Hepatic negative GI ROS, Neg liver ROS,   Endo/Other  Hypothyroidism   Renal/GU negative Renal ROS     Musculoskeletal negative musculoskeletal ROS (+)   Abdominal   Peds  Hematology negative hematology ROS (+)   Anesthesia Other Findings   Reproductive/Obstetrics  Breast cancer                             Anesthesia Physical Anesthesia Plan  ASA: 2  Anesthesia Plan: General   Post-op Pain Management: Regional block, Tylenol PO (pre-op), Celebrex PO (pre-op) and Gabapentin PO (pre-op)   Induction: Intravenous  PONV Risk Score and Plan: 3 and Treatment may vary due to age or medical condition, Ondansetron and Dexamethasone  Airway Management Planned: LMA  Additional Equipment: None  Intra-op Plan:   Post-operative Plan: Extubation in OR  Informed Consent: I have reviewed the patients History and Physical, chart, labs and discussed the procedure including the risks, benefits and alternatives for the proposed anesthesia with the patient or authorized representative who has indicated his/her understanding and acceptance.     Dental advisory given  Plan Discussed with: CRNA and Anesthesiologist  Anesthesia Plan Comments:         Anesthesia Quick Evaluation

## 2021-01-23 NOTE — Discharge Instructions (Addendum)
Post Anesthesia Home Care Instructions  Activity: Get plenty of rest for the remainder of the day. A responsible individual must stay with you for 24 hours following the procedure.  For the next 24 hours, DO NOT: -Drive a car -Paediatric nurse -Drink alcoholic beverages -Take any medication unless instructed by your physician -Make any legal decisions or sign important papers.  Meals: Start with liquid foods such as gelatin or soup. Progress to regular foods as tolerated. Avoid greasy, spicy, heavy foods. If nausea and/or vomiting occur, drink only clear liquids until the nausea and/or vomiting subsides. Call your physician if vomiting continues.  Special Instructions/Symptoms: Your throat may feel dry or sore from the anesthesia or the breathing tube placed in your throat during surgery. If this causes discomfort, gargle with warm salt water. The discomfort should disappear within 24 hours.  If you had a scopolamine patch placed behind your ear for the management of post- operative nausea and/or vomiting:  1. The medication in the patch is effective for 72 hours, after which it should be removed.  Wrap patch in a tissue and discard in the trash. Wash hands thoroughly with soap and water. 2. You may remove the patch earlier than 72 hours if you experience unpleasant side effects which may include dry mouth, dizziness or visual disturbances. 3. Avoid touching the patch. Wash your hands with soap and water after contact with the patch.      Next dose of Tylenol/NSAIDs/Ibuprofen after 1pm as needed for pain.  Regional Anesthesia Blocks  1. Numbness or the inability to move the "blocked" extremity may last from 3-48 hours after placement. The length of time depends on the medication injected and your individual response to the medication. If the numbness is not going away after 48 hours, call your surgeon.  2. The extremity that is blocked will need to be protected until the numbness is  gone and the  Strength has returned. Because you cannot feel it, you will need to take extra care to avoid injury. Because it may be weak, you may have difficulty moving it or using it. You may not know what position it is in without looking at it while the block is in effect.  3. For blocks in the legs and feet, returning to weight bearing and walking needs to be done carefully. You will need to wait until the numbness is entirely gone and the strength has returned. You should be able to move your leg and foot normally before you try and bear weight or walk. You will need someone to be with you when you first try to ensure you do not fall and possibly risk injury.  4. Bruising and tenderness at the needle site are common side effects and will resolve in a few days.  5. Persistent numbness or new problems with movement should be communicated to the surgeon or the [place] [phone] [place] [phone]).   Information for Discharge Teaching: EXPAREL (bupivacaine liposome injectable suspension)   Your surgeon or anesthesiologist gave you EXPAREL(bupivacaine) to help control your pain after surgery.  EXPAREL is a local anesthetic that provides pain relief by numbing the tissue around the surgical site. EXPAREL is designed to release pain medication over time and can control pain for up to 72 hours. Depending on how you respond to EXPAREL, you may require less pain medication during your recovery.  Possible side effects: Temporary loss of sensation or ability to move in the area where bupivacaine  was injected. Nausea, vomiting, constipation Rarely, numbness and tingling in your mouth or lips, lightheadedness, or anxiety may occur. Call your doctor right away if you think you may be experiencing any of these sensations, or if you have other questions regarding possible side effects.  Follow all other discharge instructions given to you by your surgeon or  nurse. Eat a healthy diet and drink plenty of water or other fluids.  If you return to the hospital for any reason within 96 hours following the administration of EXPAREL, it is important for health care providers to know that you have received this anesthetic. A teal colored band has been placed on your arm with the date, time and amount of EXPAREL you have received in order to alert and inform your health care providers. Please leave this armband in place for the full 96 hours following administration, and then you may remove the band.

## 2021-01-23 NOTE — Progress Notes (Signed)
Assisted Dr. Fransisco Beau with left, ultrasound guided, pectoralis block. Side rails up, monitors on throughout procedure. See vital signs in flow sheet. Tolerated Procedure well.

## 2021-01-23 NOTE — Anesthesia Procedure Notes (Signed)
Anesthesia Regional Block: Pectoralis block   Pre-Anesthetic Checklist: , timeout performed,  Correct Patient, Correct Site, Correct Laterality,  Correct Procedure, Correct Position, site marked,  Risks and benefits discussed,  Surgical consent,  Pre-op evaluation,  At surgeon's request and post-op pain management  Laterality: Left  Prep: chloraprep       Needles:  Injection technique: Single-shot  Needle Type: Echogenic Needle     Needle Length: 10cm  Needle Gauge: 21     Additional Needles:   Narrative:  Start time: 01/23/2021 7:58 AM End time: 01/23/2021 8:01 AM Injection made incrementally with aspirations every 5 mL.  Performed by: Personally  Anesthesiologist: Audry Pili, MD  Additional Notes: No pain on injection. No increased resistance to injection. Injection made in 5cc increments. Good needle visualization. Patient tolerated the procedure well.

## 2021-01-23 NOTE — H&P (Signed)
REFERRING PHYSICIAN: Heath Gold, [place]  PROVIDER: Landry Corporal, MD  MRN: [id number] DOB: 26-Feb-1955 Subjective  Chief Complaint: breast   History of Present Illness: Yvette Walker is a 65 y.o. female who is seen today as an office consultation at the request of Dr. Tobie Lords for evaluation of breast .   We are asked to see the patient in consultation by Dr. Cyndi Bender to evaluate her for a new left breast cancer. The patient is a 65 year old black female who recently went for a routine screening mammogram. At that time she was found to have a 9 mm area of distortion in the upper inner quadrant of the left breast. The axilla looked negative. 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 She denies any family history of breast cancer. She is otherwise in pretty good health and does not smoke.  Review of Systems: A complete review of systems was obtained from the patient. I have reviewed this information and discussed as appropriate with the patient. See HPI as well for other ROS.  ROS   Medical History: Past Medical History:  Diagnosis Date   History of cancer   Patient Active Problem List  Diagnosis   Malignant neoplasm of upper-inner quadrant of left breast in female, estrogen receptor positive (CMS-HCC)   Past Surgical History:  Procedure Laterality Date   HYSTERECTOMY    No Known Allergies  Current Outpatient Medications on File Prior to Visit  Medication Sig Dispense Refill   amLODIPine (NORVASC) 10 MG tablet once daily   No current facility-administered medications on file prior to visit.   Family History  Problem Relation Age of Onset   Diabetes Brother    Social History   Tobacco Use  Smoking Status Never Smoker  Smokeless Tobacco Never Used    Social History   Socioeconomic History   Marital status: Married  Tobacco Use   Smoking status: Never Smoker    Smokeless tobacco: Never Used  Substance and Sexual Activity   Alcohol use: Never   Drug use: Never   Objective:   Vitals:  BP: 120/80  Pulse: 83  Temp: 36.6 C (97.9 F)  SpO2: 100%  Weight: 56.7 kg (125 lb)  Height: 157.5 cm (5' 2" )   Body mass index is 22.86 kg/m.  Physical Exam Vitals reviewed.  Constitutional:  General: She is not in acute distress. Appearance: Normal appearance.  HENT:  Head: Normocephalic and atraumatic.  Right Ear: External ear normal.  Left Ear: External ear normal.  Nose: Nose normal.  Mouth/Throat:  Mouth: Mucous membranes are moist.  Pharynx: Oropharynx is clear.  Eyes:  General: No scleral icterus. Extraocular Movements: Extraocular movements intact.  Conjunctiva/sclera: Conjunctivae normal.  Pupils: Pupils are equal, round, and reactive to light.  Cardiovascular:  Rate and Rhythm: Normal rate and regular rhythm.  Pulses: Normal pulses.  Heart sounds: Normal heart sounds.  Pulmonary:  Effort: Pulmonary effort is normal. No respiratory distress.  Breath sounds: Normal breath sounds.  Abdominal:  General: Bowel sounds are normal.  Palpations: Abdomen is soft.  Tenderness: There is no abdominal tenderness.  Musculoskeletal:  General: No swelling, tenderness or deformity. Normal range of motion.  Cervical back: Normal range of motion and neck supple.  Skin: General: Skin is warm and dry.  Coloration: Skin is not jaundiced.  Neurological:  General: No focal deficit present.  Mental Status: She is alert  and oriented to person, place, and time.  Psychiatric:  Mood and Affect: Mood normal.  Behavior: Behavior normal.     Breast: There is no palpable mass in either breast. There is no palpable axillary, supraclavicular, or cervical lymphadenopathy.  Labs, Imaging and Diagnostic Testing:  Assessment and Plan:  Diagnoses and all orders for this visit:  Malignant neoplasm of upper-inner quadrant of left breast in female, estrogen  receptor positive (CMS-HCC) - Ambulatory Referral to Oncology-Medical - Ambulatory Referral to Radiation Oncology - Ambulatory Referral to Physical Therapy - [facility] Case Posting Request; Future    The patient appears to have a small stage I cancer in the upper inner quadrant of the left breast with clinically negative nodes. I have discussed with her in detail the different options for treatment and at this point she favors breast conservation which I feel is very reasonable. I have discussed with her in detail the risks and benefits of the operation as well as some of the technical aspects including the use of a radioactive seed for localization and she understands and wishes to proceed. She will be a good candidate for sentinel node biopsy as well. I will go ahead and refer her to medical and radiation oncology to discuss adjuvant therapy. I will also refer her to physical therapy for preoperative lymphedema testing

## 2021-01-23 NOTE — Transfer of Care (Signed)
Immediate Anesthesia Transfer of Care Note  Patient: Yvette Walker  Procedure(s) Performed: LEFT BREAST LUMPECTOMY WITH RADIOACTIVE SEED AND SENTINEL LYMPH NODE BIOPSY (Left: Breast)  Patient Location: PACU  Anesthesia Type:General  Level of Consciousness: awake, alert  and oriented  Airway & Oxygen Therapy: Patient Spontanous Breathing and Patient connected to nasal cannula oxygen  Post-op Assessment: Report given to RN and Post -op Vital signs reviewed and stable  Post vital signs: Reviewed and stable  Last Vitals:  Vitals Value Taken Time  BP 95/71 01/23/21 0945  Temp    Pulse 95 01/23/21 0951  Resp 7 01/23/21 0951  SpO2 98 % 01/23/21 0951  Vitals shown include unvalidated device data.  Last Pain:  Vitals:   01/23/21 0658  TempSrc: Oral  PainSc: 0-No pain      Patients Stated Pain Goal: 2 (14/83/07 3543)  Complications: No notable events documented.

## 2021-01-23 NOTE — Anesthesia Procedure Notes (Signed)
Procedure Name: LMA Insertion Date/Time: 01/23/2021 8:38 AM Performed by: Bufford Spikes, CRNA Pre-anesthesia Checklist: Patient identified, Emergency Drugs available, Suction available and Patient being monitored Patient Re-evaluated:Patient Re-evaluated prior to induction Oxygen Delivery Method: Circle system utilized Preoxygenation: Pre-oxygenation with 100% oxygen Induction Type: IV induction Ventilation: Mask ventilation without difficulty LMA: LMA inserted LMA Size: 4.0 Number of attempts: 1 Placement Confirmation: positive ETCO2 Tube secured with: Tape Dental Injury: Teeth and Oropharynx as per pre-operative assessment

## 2021-01-24 NOTE — Progress Notes (Signed)
Left message stating courtesy call and if any questions or concerns please call the doctors office.  

## 2021-01-25 ENCOUNTER — Encounter (HOSPITAL_BASED_OUTPATIENT_CLINIC_OR_DEPARTMENT_OTHER): Payer: Self-pay | Admitting: General Surgery

## 2021-01-25 LAB — SURGICAL PATHOLOGY

## 2021-01-25 NOTE — Anesthesia Postprocedure Evaluation (Signed)
Anesthesia Post Note  Patient: Yvette Walker  Procedure(s) Performed: LEFT BREAST LUMPECTOMY WITH RADIOACTIVE SEED AND SENTINEL LYMPH NODE BIOPSY (Left: Breast)     Patient location during evaluation: PACU Anesthesia Type: General Level of consciousness: awake and alert Pain management: pain level controlled Vital Signs Assessment: post-procedure vital signs reviewed and stable Respiratory status: spontaneous breathing, nonlabored ventilation and respiratory function stable Cardiovascular status: stable and blood pressure returned to baseline Anesthetic complications: no   No notable events documented.  Last Vitals:  Vitals:   01/23/21 1015 01/23/21 1030  BP: 119/78 114/83  Pulse: 92 87  Resp: 13 18  Temp:  36.6 C  SpO2: 98% 95%    Last Pain:  Vitals:   01/23/21 1030  TempSrc:   PainSc: 0-No pain   Pain Goal: Patients Stated Pain Goal: 2 (01/23/21 1030)                 Audry Pili

## 2021-01-30 ENCOUNTER — Encounter: Payer: Self-pay | Admitting: *Deleted

## 2021-01-30 ENCOUNTER — Telehealth: Payer: Self-pay | Admitting: *Deleted

## 2021-01-30 NOTE — Progress Notes (Signed)
Patient Care Team: Cyndi Bender, PA-C as PCP - General (Physician Assistant) Mauro Kaufmann, RN as Oncology Nurse Navigator Rockwell Germany, RN as Oncology Nurse Navigator  DIAGNOSIS:    ICD-10-CM   1. Malignant neoplasm of upper-inner quadrant of left breast in female, estrogen receptor positive ([place])  C50.212    Z17.0       SUMMARY OF ONCOLOGIC HISTORY: Oncology History  Malignant neoplasm of upper-inner quadrant of left breast in female, estrogen receptor positive ([place])  11/30/2020 Initial Diagnosis   Screening mammogram: a possible mass in the left breast. Diagnostic mammogram and [place]: highly suspicious 9 mm mass involving the UIQ of the left breast at the 11 o'clock position 6 cm from the nipple. Biopsy: Grade 2 IDC PR+(100%) ER+(95%) Her2-.   12/11/2020 Cancer Staging   Staging form: Breast, AJCC 8th Edition - Clinical stage from 12/11/2020: Stage IA (cT1b, cN0, cM0, G2, ER+, PR+, HER2-) - Signed by Nicholas Lose, MD on 12/13/2020 Stage prefix: Initial diagnosis Method of lymph node assessment: Clinical Histologic grading system: 3 grade system      CHIEF COMPLIANT: Follow-up of left breast cancer  INTERVAL HISTORY: Yvette Walker is a 65 y.o. with above-mentioned history of left breast cancer. Left lumpectomy on 01/23/2021 showed invasive mammary carcinoma and lymph nodes negative for carcinoma. She presents to the clinic today for follow-up.    ALLERGIES:  has No Known Allergies.  MEDICATIONS:  Current Outpatient Medications  Medication Sig Dispense Refill   alendronate (FOSAMAX) 70 MG tablet Take 70 mg by mouth once a week.     amLODipine (NORVASC) 10 MG tablet daily.     haloperidol (HALDOL) 20 MG tablet Take by mouth.     HYDROcodone-acetaminophen (NORCO/VICODIN) 5-325 MG tablet Take 1 tablet by mouth every 6 (six) hours as needed for moderate pain or severe pain. 10 tablet 0   metoprolol succinate (TOPROL-XL) 25 MG 24 hr tablet Take 25 mg by mouth daily.      trihexyphenidyl (ARTANE) 2 MG tablet Take 2 mg by mouth daily as needed.     No current facility-administered medications for this visit.    PHYSICAL EXAMINATION: ECOG PERFORMANCE STATUS: 1 - Symptomatic but completely ambulatory  Vitals:   01/31/21 0946  BP: 114/66  Pulse: 74  Resp: 18  Temp: (!) 97.2 F (36.2 C)  SpO2: 98%   Filed Weights   01/31/21 0946  Weight: 126 lb 14.4 oz (57.6 kg)      LABORATORY DATA:  I have reviewed the data as listed CMP Latest Ref Rng & Units 01/18/2021 06/27/2010 07/12/2009  Glucose 70 - 99 mg/dL 91 81 94  BUN 8 - 23 mg/dL 10 7 8   Creatinine 0.44 - 1.00 mg/dL 1.05(H) 0.83 0.91  Sodium 135 - 145 mmol/L 142 135 140  Potassium 3.5 - 5.1 mmol/L 5.0 3.8 3.9  Chloride 98 - 111 mmol/L 105 95(L) 104  CO2 22 - 32 mmol/L 28 29 31   Calcium 8.9 - 10.3 mg/dL 10.1 10.8(H) 9.9    Lab Results  Component Value Date   WBC 10.7 (H) 07/05/2010   HGB 10.1 (L) 07/05/2010   HCT 31.6 (L) 07/05/2010   MCV 80.4 07/05/2010   PLT 278 07/05/2010    ASSESSMENT & PLAN:  Malignant neoplasm of upper-inner quadrant of left breast in female, estrogen receptor positive ([place]) 11/30/2020: Screening mammogram: a possible mass in the left breast. Diagnostic mammogram and [place]: highly suspicious 9 mm mass involving the UIQ of the left  breast at the 11 o'clock position 6 cm from the nipple. Biopsy: Grade 2 IDC PR+(100%) ER+(95%) Her2-.   Recommendations: 1. Breast conserving surgery followed by 2. Oncotype DX testing to determine if chemotherapy would be of any benefit followed by 3. Adjuvant radiation therapy followed by 4. Adjuvant antiestrogen therapy ------------------------------------------------------------------------------------------------------------------------------- 01/23/21: Left Lumpectomy: 1.7 cm IMC, Margins Neg, 0/2 LN Neg, Grade 2 IDC PR+(100%) ER+(95%)  Her2-  Pathology counseling: I discussed the final pathology report of the patient provided  a copy of  this report. I discussed the margins as well as lymph node surgeries. We also discussed the final staging along with previously performed ER/PR and HER-2/neu testing.  RTC based on Oncotype score    No orders of the defined types were placed in this encounter.  The patient has a good understanding of the overall plan. she agrees with it. she will call with any problems that may develop before the next visit here.  Total time spent: 20 mins including face to face time and time spent for planning, charting and coordination of care  Rulon Eisenmenger, MD, MPH 01/31/2021  I, Thana Ates, am acting as scribe for Dr. Nicholas Lose.  I have reviewed the above documentation for accuracy and completeness, and I agree with the above.

## 2021-01-30 NOTE — Telephone Encounter (Signed)
Ordered oncotype per Dr. Gudena. Faxed requisition to pathology and exact sciences 

## 2021-01-30 NOTE — Assessment & Plan Note (Signed)
11/30/2020: Screening mammogram: a possible mass in the left breast. Diagnostic mammogram and [place]: highly suspicious 9 mm mass involving the UIQ of the left breast at the 11 o'clock position 6 cm from the nipple. Biopsy: Grade 2 IDC PR+(100%) ER+(95%) Her2-.  Recommendations: 1. Breast conserving surgery followed by 2. Oncotype DX testing to determine if chemotherapy would be of any benefit followed by 3. Adjuvant radiation therapy followed by 4. Adjuvant antiestrogen therapy ------------------------------------------------------------------------------------------------------------------------------- 01/23/21: Left Lumpectomy: 1.7 cm IMC, Margins Neg, 0/2 LN Neg, Grade 2 IDC PR+(100%) ER+(95%)  Her2-  Pathology counseling: I discussed the final pathology report of the patient provided  a copy of this report. I discussed the margins as well as lymph node surgeries. We also discussed the final staging along with previously performed ER/PR and HER-2/neu testing.  RTC based on Oncotype score

## 2021-01-31 ENCOUNTER — Other Ambulatory Visit: Payer: Self-pay

## 2021-01-31 ENCOUNTER — Inpatient Hospital Stay: Payer: Medicare Other | Attending: Hematology and Oncology | Admitting: Hematology and Oncology

## 2021-01-31 DIAGNOSIS — C50212 Malignant neoplasm of upper-inner quadrant of left female breast: Secondary | ICD-10-CM | POA: Diagnosis present

## 2021-01-31 DIAGNOSIS — Z79899 Other long term (current) drug therapy: Secondary | ICD-10-CM | POA: Insufficient documentation

## 2021-01-31 DIAGNOSIS — Z170 Estrogen receptor positive status [ER+]: Secondary | ICD-10-CM | POA: Diagnosis not present

## 2021-02-13 ENCOUNTER — Other Ambulatory Visit: Payer: Self-pay

## 2021-02-13 ENCOUNTER — Ambulatory Visit: Payer: Medicare Other | Attending: General Surgery | Admitting: Rehabilitation

## 2021-02-13 ENCOUNTER — Encounter: Payer: Self-pay | Admitting: Rehabilitation

## 2021-02-13 DIAGNOSIS — Z483 Aftercare following surgery for neoplasm: Secondary | ICD-10-CM

## 2021-02-13 DIAGNOSIS — R293 Abnormal posture: Secondary | ICD-10-CM

## 2021-02-13 DIAGNOSIS — C50912 Malignant neoplasm of unspecified site of left female breast: Secondary | ICD-10-CM

## 2021-02-13 DIAGNOSIS — Z170 Estrogen receptor positive status [ER+]: Secondary | ICD-10-CM | POA: Insufficient documentation

## 2021-02-13 NOTE — Patient Instructions (Addendum)
[facility]  [address], [address]  [place] [address]  [phone] What is Lymphedema  Lymphedema is when fluid from the body's tissues that usually drains into lymphatic vessels is unable to drain. This fluid is called lymph. Lymphatic vessels carry the fluid to lymph nodes where substances that could be harmful, such as bacteria, are filtered out and destroyed. This helps to protect the body from infection. The lymph then passes back into the main blood vessels. There are lymph nodes all around the body, including the armpit, groin, abdomen, chest, elbows, knees and neck. Primary Lymphedema Primary lymphedema is when the lymph nodes are not working properly, there are not enough lymph nodes or lymph nodes are not present at all since birth. This will cause swelling over time usually presenting itself in the teenage years to early 28's.  Secondary Lymphedema Secondary lymphedema occurs due to the damage, blockage or absence of lymph vessels due to injury, surgery or with certain conditions such as veins not functioning properly, and abnormal accumulation of fat tissue called lipedema.   Stages of Lymphedema Stage 0 - No swelling is present but there is an increased risk due to vascular disease, surgery or injury. Stage 1 - Visible swelling is present that decreases temporarily with elevation but will return soon after arm or leg is placed back in a normal position. ie: swelling goes down at night and increases throughout the day.  Stage 2 - Swelling increases and skin changes begin including firming of the skin due to fibrosis which is hardening related to normal tissue getting replaced by scar tissue due to damage from prolonged swelling.  Stage 3 - Tissue becomes even more swollen and thickened causing the arm or leg to change shape substantially increasing risk for infection, skin break down and mobility issues. This stage is still treatable and able to improve  but cannot be reversed to an earlier stage.  Lymphedema Treatment Lymphedema is treated best through Complete Decongestive Therapy which is a combination of light massage performed by yourself and a trained professional called manual lymph drainage (MLD), compression with wraps or compression garments, light exercise and skin care.    Scar massage You can begin gentle scar massage to you incision sites. Gently place one hand on the incision and move the skin (without sliding on the skin) in various directions. Do this for a few minutes and then you can gently massage either coconut oil or vitamin E cream into the scars.  Home exercise Program Continue doing the exercises you were given until you feel like you can do them without feeling any tightness at the end.   Walking Program Studies show that 30 minutes of walking per day (fast enough to elevate your heart rate) can significantly reduce the risk of a cancer recurrence. If you can't walk due to other medical reasons, we encourage you to find another activity you could do (like a stationary bike or water exercise).  Posture After breast cancer surgery, people frequently sit with rounded shoulders posture because it puts their incisions on slack and feels better. If you sit like this and scar tissue forms in that position, you can become very tight and have pain sitting or standing with good posture. Try to be aware of your posture and sit and stand up tall to heal properly.  Follow up PT: It is recommended you return every 3 months for the first 3 years following surgery to be assessed on  the SOZO machine for an L-Dex score. This helps prevent clinically significant lymphedema in 95% of patients. These follow up screens are 10 minute appointments that you are not billed for.  March 13 @ 10am

## 2021-02-13 NOTE — Therapy (Signed)
[facility] @ [place], [place], [address] Phone: [phone]   Fax:  [phone]  Physical Therapy Treatment  Patient Details  Name: ASTRA GREGG MRN: [id number] Date of Birth: October 28, 1955 Referring Provider (PT): Dr. Marlou Starks   Encounter Date: 02/13/2021   PT End of Session - 02/13/21 1009     Visit Number 2    Number of Visits 2    Date for PT Re-Evaluation 03/07/21    PT Start Time 0950    PT Stop Time 1010    PT Time Calculation (min) 20 min    Activity Tolerance Patient tolerated treatment well    Behavior During Therapy [facility] for tasks assessed/performed             Past Medical History:  Diagnosis Date   Breast cancer ([place]) 11/30/2020   Hypertension    Hypothyroidism     Past Surgical History:  Procedure Laterality Date   ABDOMINAL HYSTERECTOMY     BREAST LUMPECTOMY WITH RADIOACTIVE SEED AND SENTINEL LYMPH NODE BIOPSY Left 01/23/2021   Procedure: LEFT BREAST LUMPECTOMY WITH RADIOACTIVE SEED AND SENTINEL LYMPH NODE BIOPSY;  Surgeon: Jovita Kussmaul, MD;  Location: [facility];  Service: General;  Laterality: Left;   TUBAL LIGATION      There were no vitals filed for this visit.   Subjective Assessment - 02/13/21 0950     Subjective No problems.  Surgery was okay.    Pertinent History Lt lumpectomy 01/23/21 with 0/2LNs removed.  Pt will have radiation and chemotherapy is pending.    Currently in Pain? --   intermittent axillary pain               [facility] PT Assessment - 02/13/21 0001       Assessment   Medical Diagnosis left breast cancer    Referring Provider (PT) Dr. Marlou Starks    Onset Date/Surgical Date 01/23/21    Hand Dominance Left      Prior Function   Level of [place] Independent    Vocation Retired      Observation/Other Assessments   Observations pt felt like no special bras were needed      Observation/Other Assessments-Edema    Edema --   has not  noticed swelling in the breast, axilla, or UE     Sensation   Additional Comments no numbness      AROM   Left Shoulder Flexion 150 Degrees    Left Shoulder ABduction 158 Degrees   slight pull in the axilla   Left Shoulder External Rotation 90 Degrees      Palpation   Palpation comment no cording palpated in axilla - no edema noted               LYMPHEDEMA/ONCOLOGY QUESTIONNAIRE - 02/13/21 0001       Left Upper Extremity Lymphedema   10 cm Proximal to Olecranon Process 25.5 cm    Olecranon Process 23 cm    Just Proximal to Ulnar Styloid Process 15 cm                                             Plan - 02/13/21 1010     Clinical Impression Statement Pt is seen 3 weeks post lumpectomy and SLNB with return to full baseline AROM except for 5deg of abduction.  Reminded pt which exercises helped with abduction end range.  No therapy needs at this time and pt is scheduled for next SOZO in March.  lymphedema education and risk reduction education performed as pt does not plan on the ABC class.    PT Next Visit Plan 3 month SOZO    Consulted and Agree with Plan of Care Patient             Patient will benefit from skilled therapeutic intervention in order to improve the following deficits and impairments:     Visit Diagnosis: Malignant neoplasm of left breast in female, estrogen receptor positive, unspecified site of breast ([place])  Abnormal posture  Aftercare following surgery for neoplasm     Problem List Patient Active Problem List   Diagnosis Date Noted   Malignant neoplasm of upper-inner quadrant of left breast in female, estrogen receptor positive ([place]) 12/11/2020    Stark Bray, PT 02/13/2021, 10:26 AM  [facility] @ [place], [place], [address] Phone: [phone]   Fax:  [phone]  Name: NAAMAH BOGGESS MRN: [id number] Date of Birth:  03-22-1955

## 2021-02-14 ENCOUNTER — Encounter: Payer: Self-pay | Admitting: *Deleted

## 2021-02-14 ENCOUNTER — Telehealth: Payer: Self-pay | Admitting: *Deleted

## 2021-02-14 ENCOUNTER — Encounter (HOSPITAL_COMMUNITY): Payer: Self-pay

## 2021-02-14 DIAGNOSIS — Z170 Estrogen receptor positive status [ER+]: Secondary | ICD-10-CM

## 2021-02-14 NOTE — Telephone Encounter (Signed)
Received oncotype score of 14. Physician team notified. Called pt informed chemo is not recommended and next step is xrt with Dr. Lisbeth Renshaw. Received verbal understanding.  Referral placed to see Dr. Lisbeth Renshaw

## 2021-02-15 ENCOUNTER — Encounter: Payer: Self-pay | Admitting: Hematology and Oncology

## 2021-02-19 ENCOUNTER — Telehealth: Payer: Self-pay

## 2021-02-19 NOTE — Telephone Encounter (Addendum)
Called patient X2 and left a friendly reminder of her 10:30am-02/20/21 in-person appointment w/ Shona Simpson PA-C. Advised check-in at 9:45am for nursing appointment at 9:00am. I left my extension [phone] for patient to call w/ any questions.

## 2021-02-20 ENCOUNTER — Ambulatory Visit
Admission: RE | Admit: 2021-02-20 | Discharge: 2021-02-20 | Disposition: A | Discharge status: Home / Self Care | Payer: Medicare Other | Source: Ambulatory Visit | Attending: Radiation Oncology | Admitting: Radiation Oncology

## 2021-02-20 ENCOUNTER — Other Ambulatory Visit: Payer: Self-pay

## 2021-02-20 ENCOUNTER — Encounter: Payer: Self-pay | Admitting: Radiation Oncology

## 2021-02-20 VITALS — BP 115/79 | HR 78 | Temp 96.4°F | Resp 18 | Ht 62.0 in | Wt 128.1 lb

## 2021-02-20 DIAGNOSIS — I10 Essential (primary) hypertension: Secondary | ICD-10-CM | POA: Insufficient documentation

## 2021-02-20 DIAGNOSIS — E039 Hypothyroidism, unspecified: Secondary | ICD-10-CM | POA: Diagnosis not present

## 2021-02-20 DIAGNOSIS — Z170 Estrogen receptor positive status [ER+]: Secondary | ICD-10-CM | POA: Insufficient documentation

## 2021-02-20 DIAGNOSIS — C50212 Malignant neoplasm of upper-inner quadrant of left female breast: Secondary | ICD-10-CM

## 2021-02-20 DIAGNOSIS — Z79899 Other long term (current) drug therapy: Secondary | ICD-10-CM | POA: Insufficient documentation

## 2021-02-20 DIAGNOSIS — Z808 Family history of malignant neoplasm of other organs or systems: Secondary | ICD-10-CM | POA: Insufficient documentation

## 2021-02-20 DIAGNOSIS — Z510 Encounter for antineoplastic radiation therapy: Secondary | ICD-10-CM | POA: Diagnosis present

## 2021-02-20 NOTE — Progress Notes (Signed)
Patient states doing well. No symptoms reported at this time.   Meaningful use complete. Perimenopausal- NO chances of pregnancy.  BP 115/79 (BP Location: Right Arm, Patient Position: Sitting, Cuff Size: Normal)    Pulse 78    Temp (!) 96.4 F (35.8 C) (Temporal)    Resp 18    Ht 5\' 2"  (1.575 m)    Wt 128 lb 2 oz (58.1 kg)    LMP  (LMP Unknown)    SpO2 100%    BMI 23.43 kg/m

## 2021-02-20 NOTE — Progress Notes (Signed)
Radiation Oncology         (336) [phone] ________________________________  Follow Up   Name: Yvette Walker        MRN: [id number]  Date of Service: 02/20/2021 DOB: November 22, 1955  YQ:MGNOIB, Yvette Alexander, MD     REFERRING PHYSICIAN: Nicholas Lose, MD   DIAGNOSIS: The encounter diagnosis was Malignant neoplasm of upper-inner quadrant of left breast in female, estrogen receptor positive ([place]).   HISTORY OF PRESENT ILLNESS: Yvette Walker is a 66 y.o. female with a diagnosis of left breast cancer.  The patient was found to have screening detected mass in the left breast.  The right breast did not show any abnormalities.  She underwent further diagnostic imaging which revealed a mass in the posterior left breast at the 11 o'clock position measuring 9 mm in greatest dimension the axilla was negative for adenopathy.  A biopsy on 11/30/2020 revealed a grade 2 invasive ductal carcinoma that was ER/PR positive, HER2 was negative with a Ki-67 of 5%.    Since her last visit, she underwent left lumpectomy with sentinel lymph node biopsy with Dr. Marlou Starks on 01/23/21 and final pathology showed a grade 2 invasive ductal carcinoma measuring 1.7 cm. Two sentinel nodes were negative, and the posterior, medial and inferior margins were 1 mm from invasive disease. She had an oncotype score of 14 and no chemotherapy is planned. No additional surgery is planned. She's seen to discuss adjuvant radiotherapy.   PREVIOUS RADIATION THERAPY: No   PAST MEDICAL HISTORY:  Past Medical History:  Diagnosis Date   Breast cancer ([place]) 11/30/2020   Hypertension    Hypothyroidism        PAST SURGICAL HISTORY: Past Surgical History:  Procedure Laterality Date   ABDOMINAL HYSTERECTOMY     BREAST LUMPECTOMY WITH RADIOACTIVE SEED AND SENTINEL LYMPH NODE BIOPSY Left 01/23/2021   Procedure: LEFT BREAST LUMPECTOMY WITH RADIOACTIVE SEED AND SENTINEL LYMPH NODE BIOPSY;  Surgeon: Jovita Kussmaul, MD;  Location: [facility];  Service: General;  Laterality: Left;   TUBAL LIGATION       FAMILY HISTORY:  Family History  Problem Relation Age of Onset   Cervical cancer Sister      SOCIAL HISTORY:  reports that she has never smoked. She has never used smokeless tobacco. She reports that she does not drink alcohol and does not use drugs. The patient is married and lives in [place], [place]. She's retired from working in [employer] setting.    ALLERGIES: Patient has no known allergies.   MEDICATIONS:  Current Outpatient Medications  Medication Sig Dispense Refill   alendronate (FOSAMAX) 70 MG tablet Take 70 mg by mouth once a week.     amLODipine (NORVASC) 10 MG tablet daily.     haloperidol (HALDOL) 20 MG tablet Take by mouth.     HYDROcodone-acetaminophen (NORCO/VICODIN) 5-325 MG tablet Take 1 tablet by mouth every 6 (six) hours as needed for moderate pain or severe pain. 10 tablet 0   metoprolol succinate (TOPROL-XL) 25 MG 24 hr tablet Take 25 mg by mouth daily.     trihexyphenidyl (ARTANE) 2 MG tablet Take 2 mg by mouth daily as needed.     No current facility-administered medications for this encounter.     REVIEW OF SYSTEMS: On review of systems, the patient reports that she is doing well and denies any concerns with healing of her breast. She is not in any pain and sees Dr. Marlou Starks tomorrow for a  postop check. No other complaints are verbalized.      PHYSICAL EXAM:  Wt Readings from Last 3 Encounters:  01/31/21 126 lb 14.4 oz (57.6 kg)  01/23/21 127 lb 6.8 oz (57.8 kg)  12/13/20 125 lb 8 oz (56.9 kg)   Temp Readings from Last 3 Encounters:  01/31/21 (!) 97.2 F (36.2 C) (Temporal)  01/23/21 97.8 F (36.6 C)  12/13/20 97.7 F (36.5 C) (Temporal)   BP Readings from Last 3 Encounters:  01/31/21 114/66  01/23/21 114/83  12/13/20 125/69   Pulse Readings from Last 3 Encounters:  01/31/21 74  01/23/21 87  12/13/20 74   In general this is a well appearing African American female  in no acute distress. She's alert and oriented x4 and appropriate throughout the examination. Cardiopulmonary assessment is negative for acute distress and she exhibits normal effort. The left breast and axillary incision sites are intact without separation, drainage, or erythema.     ECOG = 0  0 - Asymptomatic (Fully active, able to carry on all predisease activities without restriction)  1 - Symptomatic but completely ambulatory (Restricted in physically strenuous activity but ambulatory and able to carry out work of a light or sedentary nature. For example, light housework, office work)  2 - Symptomatic, <50% in bed during the day (Ambulatory and capable of all self care but unable to carry out any work activities. Up and about more than 50% of waking hours)  3 - Symptomatic, >50% in bed, but not bedbound (Capable of only limited self-care, confined to bed or chair 50% or more of waking hours)  4 - Bedbound (Completely disabled. Cannot carry on any self-care. Totally confined to bed or chair)  5 - Death   Eustace Pen MM, Creech RH, Tormey DC, et al. [phone]). "Toxicity and response criteria of the [facility] Group". [place] Oncol. 5 (6): 649-55    LABORATORY DATA:  Lab Results  Component Value Date   WBC 10.7 (H) 07/05/2010   HGB 10.1 (L) 07/05/2010   HCT 31.6 (L) 07/05/2010   MCV 80.4 07/05/2010   PLT 278 07/05/2010   Lab Results  Component Value Date   NA 142 01/18/2021   K 5.0 01/18/2021   CL 105 01/18/2021   CO2 28 01/18/2021   No results found for: ALT, AST, GGT, ALKPHOS, BILITOT    RADIOGRAPHY: MM Breast Surgical Specimen  Result Date: 01/23/2021 CLINICAL DATA:  Left lumpectomy breast cancer. EXAM: SPECIMEN RADIOGRAPH OF THE LEFT BREAST COMPARISON:  Previous exam(s). FINDINGS: Status post excision of the left breast. The radioactive seed and ribbon shaped biopsy marker clip are present, completely intact. IMPRESSION: Specimen radiograph of the left  breast. Electronically Signed   By: Claudie Revering M.D.   On: 01/23/2021 09:34  [place] LT RADIOACTIVE SEED LOC  Result Date: 01/22/2021 CLINICAL DATA:  66 year old female for ultrasound-guided radioactive seed localization of LEFT breast cancer prior to lumpectomy. EXAM: ULTRASOUND GUIDED RADIOACTIVE SEED LOCALIZATION OF THE LEFT BREAST COMPARISON:  Previous exam(s). FINDINGS: Patient presents for radioactive seed localization prior to lumpectomy. I met with the patient and we discussed the procedure of seed localization including benefits and alternatives. We discussed the high likelihood of a successful procedure. We discussed the risks of the procedure including infection, bleeding, tissue injury and further surgery. We discussed the low dose of radioactivity involved in the procedure. Informed, written consent was given. The usual time-out protocol was performed immediately prior to the procedure. Using ultrasound guidance, sterile technique,  1% lidocaine and an I-125 radioactive seed, 0.9 cm mass at the 11 o'clock position of the LEFT breast 6 cm from the nipple was localized using a MEDIAL approach. The follow-up mammogram images confirm the seed in the expected location and were marked for Dr. Marlou Starks. Follow-up survey of the patient confirms presence of the radioactive seed. Order number of I-125 seed:  [id number]. Total activity:  4.496 millicuries.  Reference Date: 12/13/2020 The patient tolerated the procedure well and was released from the [facility]. She was given instructions regarding seed removal. IMPRESSION: Radioactive seed localization LEFT breast. No apparent complications. Electronically Signed   By: Margarette Canada M.D.   On: 01/22/2021 13:36  MM CLIP PLACEMENT LEFT  Result Date: 01/22/2021 CLINICAL DATA:  Evaluate radioactive seed placement following ultrasound-guided radioactive seed localization of LEFT breast cancer. EXAM: DIAGNOSTIC LEFT MAMMOGRAM POST ULTRASOUND-GUIDED RADIOACTIVE SEED  PLACEMENT COMPARISON:  Previous exam(s). FINDINGS: Mammographic images were obtained following ultrasound-guided radioactive seed placement. These demonstrate the radioactive seed in satisfactory position adjacent to the RIBBON biopsy clip along the mass in the posterior UPPER LEFT breast. IMPRESSION: Appropriate location of the radioactive seed. Final Assessment: Post Procedure Mammograms for Seed Placement Electronically Signed   By: Margarette Canada M.D.   On: 01/22/2021 13:36      IMPRESSION/PLAN: 1. Stage IA, pT1cN0M0 grade 2, ER/PR positive invasive ductal carcinoma of the left breast. Dr. Lisbeth Renshaw discusses the final pathology findings and reviews rationale for external radiotherapy to the breast  to reduce risks of local recurrence followed by antiestrogen therapy. She has done well since surgery and no systemic therapy is needed. We discussed the risks, benefits, short, and long term effects of radiotherapy, as well as the curative intent, and the patient is interested in proceeding. Dr. Lisbeth Renshaw discusses the delivery and logistics of radiotherapy and recommends 4  weeks of radiotherapy to the left breast with deep inspiration breath hold technique. Written consent is obtained and placed in the chart, a copy was provided to the patient. She will simulate this morning.   In a visit lasting 45 minutes, greater than 50% of the time was spent face to face discussing the patient's condition, in preparation for the discussion, and coordinating the patient's care.   The above documentation reflects my direct findings during this shared patient visit. Please see the separate note by Dr. Lisbeth Renshaw on this date for the remainder of the patient's plan of care.    Carola Rhine, [facility]    **Disclaimer: This note was dictated with voice recognition software. Similar sounding words can inadvertently be transcribed and this note may contain transcription errors which may not have been corrected upon publication of  note.**

## 2021-02-21 ENCOUNTER — Encounter: Payer: Self-pay | Admitting: *Deleted

## 2021-02-21 ENCOUNTER — Telehealth: Payer: Self-pay | Admitting: Hematology and Oncology

## 2021-02-21 NOTE — Telephone Encounter (Signed)
Scheduled per 01/05 scheduled message, patient has been called and notified. °

## 2021-02-26 DIAGNOSIS — Z510 Encounter for antineoplastic radiation therapy: Secondary | ICD-10-CM | POA: Diagnosis not present

## 2021-02-27 ENCOUNTER — Other Ambulatory Visit: Payer: Self-pay

## 2021-02-27 ENCOUNTER — Ambulatory Visit
Admission: RE | Admit: 2021-02-27 | Discharge: 2021-02-27 | Disposition: A | Discharge status: Home / Self Care | Payer: Medicare Other | Source: Ambulatory Visit | Attending: Radiation Oncology | Admitting: Radiation Oncology

## 2021-02-27 DIAGNOSIS — Z510 Encounter for antineoplastic radiation therapy: Secondary | ICD-10-CM | POA: Diagnosis not present

## 2021-02-28 ENCOUNTER — Ambulatory Visit
Admission: RE | Admit: 2021-02-28 | Discharge: 2021-02-28 | Disposition: A | Discharge status: Home / Self Care | Payer: Medicare Other | Source: Ambulatory Visit | Attending: Radiation Oncology | Admitting: Radiation Oncology

## 2021-02-28 DIAGNOSIS — Z510 Encounter for antineoplastic radiation therapy: Secondary | ICD-10-CM | POA: Diagnosis not present

## 2021-02-28 NOTE — Progress Notes (Signed)

## 2021-03-01 ENCOUNTER — Other Ambulatory Visit: Payer: Self-pay

## 2021-03-01 ENCOUNTER — Ambulatory Visit
Admission: RE | Admit: 2021-03-01 | Discharge: 2021-03-01 | Disposition: A | Discharge status: Home / Self Care | Payer: Medicare Other | Source: Ambulatory Visit | Attending: Radiation Oncology | Admitting: Radiation Oncology

## 2021-03-01 DIAGNOSIS — C50212 Malignant neoplasm of upper-inner quadrant of left female breast: Secondary | ICD-10-CM

## 2021-03-01 DIAGNOSIS — Z510 Encounter for antineoplastic radiation therapy: Secondary | ICD-10-CM | POA: Diagnosis not present

## 2021-03-01 MED ORDER — ALRA NON-METALLIC DEODORANT (RAD-ONC)
1.0000 "application " | Freq: Once | TOPICAL | Status: AC
  Administered 2021-03-01: 1 via TOPICAL

## 2021-03-01 MED ORDER — RADIAPLEXRX EX GEL: Freq: Once | CUTANEOUS | Status: AC

## 2021-03-04 ENCOUNTER — Ambulatory Visit
Admission: RE | Admit: 2021-03-04 | Discharge: 2021-03-04 | Disposition: A | Discharge status: Home / Self Care | Payer: Medicare Other | Source: Ambulatory Visit | Attending: Radiation Oncology | Admitting: Radiation Oncology

## 2021-03-04 ENCOUNTER — Other Ambulatory Visit: Payer: Self-pay

## 2021-03-04 DIAGNOSIS — Z510 Encounter for antineoplastic radiation therapy: Secondary | ICD-10-CM | POA: Diagnosis not present

## 2021-03-05 ENCOUNTER — Other Ambulatory Visit: Payer: Self-pay

## 2021-03-05 ENCOUNTER — Ambulatory Visit
Admission: RE | Admit: 2021-03-05 | Discharge: 2021-03-05 | Disposition: A | Discharge status: Home / Self Care | Payer: Medicare Other | Source: Ambulatory Visit | Attending: Radiation Oncology | Admitting: Radiation Oncology

## 2021-03-05 DIAGNOSIS — Z510 Encounter for antineoplastic radiation therapy: Secondary | ICD-10-CM | POA: Diagnosis not present

## 2021-03-06 ENCOUNTER — Other Ambulatory Visit: Payer: Self-pay

## 2021-03-06 ENCOUNTER — Ambulatory Visit
Admission: RE | Admit: 2021-03-06 | Discharge: 2021-03-06 | Disposition: A | Discharge status: Home / Self Care | Payer: Medicare Other | Source: Ambulatory Visit | Attending: Radiation Oncology | Admitting: Radiation Oncology

## 2021-03-06 ENCOUNTER — Encounter: Payer: Self-pay | Admitting: *Deleted

## 2021-03-06 DIAGNOSIS — Z510 Encounter for antineoplastic radiation therapy: Secondary | ICD-10-CM | POA: Diagnosis not present

## 2021-03-07 ENCOUNTER — Ambulatory Visit
Admission: RE | Admit: 2021-03-07 | Discharge: 2021-03-07 | Disposition: A | Discharge status: Home / Self Care | Payer: Medicare Other | Source: Ambulatory Visit | Attending: Radiation Oncology | Admitting: Radiation Oncology

## 2021-03-07 DIAGNOSIS — Z510 Encounter for antineoplastic radiation therapy: Secondary | ICD-10-CM | POA: Diagnosis not present

## 2021-03-08 ENCOUNTER — Other Ambulatory Visit: Payer: Self-pay

## 2021-03-08 ENCOUNTER — Ambulatory Visit
Admission: RE | Admit: 2021-03-08 | Discharge: 2021-03-08 | Disposition: A | Discharge status: Home / Self Care | Payer: Medicare Other | Source: Ambulatory Visit | Attending: Radiation Oncology | Admitting: Radiation Oncology

## 2021-03-08 DIAGNOSIS — Z510 Encounter for antineoplastic radiation therapy: Secondary | ICD-10-CM | POA: Diagnosis not present

## 2021-03-11 ENCOUNTER — Ambulatory Visit
Admission: RE | Admit: 2021-03-11 | Discharge: 2021-03-11 | Disposition: A | Discharge status: Home / Self Care | Payer: Medicare Other | Source: Ambulatory Visit | Attending: Radiation Oncology | Admitting: Radiation Oncology

## 2021-03-11 ENCOUNTER — Other Ambulatory Visit: Payer: Self-pay

## 2021-03-11 DIAGNOSIS — Z510 Encounter for antineoplastic radiation therapy: Secondary | ICD-10-CM | POA: Diagnosis not present

## 2021-03-12 ENCOUNTER — Ambulatory Visit
Admission: RE | Admit: 2021-03-12 | Discharge: 2021-03-12 | Disposition: A | Discharge status: Home / Self Care | Payer: Medicare Other | Source: Ambulatory Visit | Attending: Radiation Oncology | Admitting: Radiation Oncology

## 2021-03-12 DIAGNOSIS — Z510 Encounter for antineoplastic radiation therapy: Secondary | ICD-10-CM | POA: Diagnosis not present

## 2021-03-13 ENCOUNTER — Ambulatory Visit: Payer: Medicare Other

## 2021-03-14 ENCOUNTER — Ambulatory Visit: Payer: Medicare Other

## 2021-03-14 ENCOUNTER — Other Ambulatory Visit: Payer: Self-pay

## 2021-03-14 ENCOUNTER — Ambulatory Visit
Admission: RE | Admit: 2021-03-14 | Discharge: 2021-03-14 | Disposition: A | Discharge status: Home / Self Care | Payer: Medicare Other | Source: Ambulatory Visit | Attending: Radiation Oncology | Admitting: Radiation Oncology

## 2021-03-14 DIAGNOSIS — Z510 Encounter for antineoplastic radiation therapy: Secondary | ICD-10-CM | POA: Diagnosis not present

## 2021-03-15 ENCOUNTER — Ambulatory Visit
Admission: RE | Admit: 2021-03-15 | Discharge: 2021-03-15 | Disposition: A | Discharge status: Home / Self Care | Payer: Medicare Other | Source: Ambulatory Visit | Attending: Radiation Oncology | Admitting: Radiation Oncology

## 2021-03-15 ENCOUNTER — Other Ambulatory Visit: Payer: Self-pay

## 2021-03-15 DIAGNOSIS — Z510 Encounter for antineoplastic radiation therapy: Secondary | ICD-10-CM | POA: Diagnosis not present

## 2021-03-18 ENCOUNTER — Ambulatory Visit
Admission: RE | Admit: 2021-03-18 | Discharge: 2021-03-18 | Disposition: A | Discharge status: Home / Self Care | Payer: Medicare Other | Source: Ambulatory Visit | Attending: Radiation Oncology | Admitting: Radiation Oncology

## 2021-03-18 ENCOUNTER — Other Ambulatory Visit: Payer: Self-pay

## 2021-03-18 DIAGNOSIS — Z510 Encounter for antineoplastic radiation therapy: Secondary | ICD-10-CM | POA: Diagnosis not present

## 2021-03-19 ENCOUNTER — Ambulatory Visit
Admission: RE | Admit: 2021-03-19 | Discharge: 2021-03-19 | Disposition: A | Discharge status: Home / Self Care | Payer: Medicare Other | Source: Ambulatory Visit | Attending: Radiation Oncology | Admitting: Radiation Oncology

## 2021-03-19 ENCOUNTER — Other Ambulatory Visit: Payer: Self-pay

## 2021-03-19 DIAGNOSIS — Z510 Encounter for antineoplastic radiation therapy: Secondary | ICD-10-CM | POA: Diagnosis not present

## 2021-03-20 ENCOUNTER — Ambulatory Visit
Admission: RE | Admit: 2021-03-20 | Discharge: 2021-03-20 | Disposition: A | Discharge status: Home / Self Care | Payer: Medicare Other | Source: Ambulatory Visit | Attending: Radiation Oncology | Admitting: Radiation Oncology

## 2021-03-20 DIAGNOSIS — Z170 Estrogen receptor positive status [ER+]: Secondary | ICD-10-CM | POA: Insufficient documentation

## 2021-03-20 DIAGNOSIS — C50212 Malignant neoplasm of upper-inner quadrant of left female breast: Secondary | ICD-10-CM | POA: Insufficient documentation

## 2021-03-20 DIAGNOSIS — Z510 Encounter for antineoplastic radiation therapy: Secondary | ICD-10-CM | POA: Diagnosis not present

## 2021-03-21 ENCOUNTER — Other Ambulatory Visit: Payer: Self-pay

## 2021-03-21 ENCOUNTER — Ambulatory Visit: Payer: Medicare Other | Admitting: Radiation Oncology

## 2021-03-21 ENCOUNTER — Ambulatory Visit
Admission: RE | Admit: 2021-03-21 | Discharge: 2021-03-21 | Disposition: A | Discharge status: Home / Self Care | Payer: Medicare Other | Source: Ambulatory Visit | Attending: Radiation Oncology | Admitting: Radiation Oncology

## 2021-03-21 DIAGNOSIS — Z510 Encounter for antineoplastic radiation therapy: Secondary | ICD-10-CM | POA: Diagnosis not present

## 2021-03-22 ENCOUNTER — Ambulatory Visit
Admission: RE | Admit: 2021-03-22 | Discharge: 2021-03-22 | Disposition: A | Discharge status: Home / Self Care | Payer: Medicare Other | Source: Ambulatory Visit | Attending: Radiation Oncology | Admitting: Radiation Oncology

## 2021-03-22 ENCOUNTER — Ambulatory Visit: Payer: Medicare Other

## 2021-03-22 ENCOUNTER — Other Ambulatory Visit: Payer: Self-pay

## 2021-03-22 DIAGNOSIS — Z510 Encounter for antineoplastic radiation therapy: Secondary | ICD-10-CM | POA: Diagnosis not present

## 2021-03-25 ENCOUNTER — Ambulatory Visit
Admission: RE | Admit: 2021-03-25 | Discharge: 2021-03-25 | Disposition: A | Discharge status: Home / Self Care | Payer: Medicare Other | Source: Ambulatory Visit | Attending: Radiation Oncology | Admitting: Radiation Oncology

## 2021-03-25 ENCOUNTER — Ambulatory Visit: Payer: Medicare Other | Admitting: Hematology and Oncology

## 2021-03-25 ENCOUNTER — Other Ambulatory Visit: Payer: Self-pay

## 2021-03-25 ENCOUNTER — Encounter: Payer: Self-pay | Admitting: *Deleted

## 2021-03-25 DIAGNOSIS — Z510 Encounter for antineoplastic radiation therapy: Secondary | ICD-10-CM | POA: Diagnosis not present

## 2021-03-25 DIAGNOSIS — C50212 Malignant neoplasm of upper-inner quadrant of left female breast: Secondary | ICD-10-CM

## 2021-03-25 DIAGNOSIS — Z170 Estrogen receptor positive status [ER+]: Secondary | ICD-10-CM

## 2021-03-26 ENCOUNTER — Other Ambulatory Visit: Payer: Self-pay

## 2021-03-26 ENCOUNTER — Ambulatory Visit
Admission: RE | Admit: 2021-03-26 | Discharge: 2021-03-26 | Disposition: A | Discharge status: Home / Self Care | Payer: Medicare Other | Source: Ambulatory Visit | Attending: Radiation Oncology | Admitting: Radiation Oncology

## 2021-03-26 ENCOUNTER — Ambulatory Visit: Payer: Medicare Other

## 2021-03-26 DIAGNOSIS — Z510 Encounter for antineoplastic radiation therapy: Secondary | ICD-10-CM | POA: Diagnosis not present

## 2021-03-26 NOTE — Progress Notes (Signed)
°                                                                                                                                                          °  Patient Name: Yvette Walker MRN: [id number] DOB: 11/27/1955 Referring Physician: Cyndi Bender (Profile Not Attached) Date of Service: 03/27/2021 [facility], [place]                                                        End Of Treatment Note  Diagnoses: C50.212-Malignant neoplasm of upper-inner quadrant of left female breast  Cancer Staging: Stage IA, pT1cN0M0 grade 2, ER/PR positive invasive ductal carcinoma of the left breast.  Intent: Curative  Radiation Treatment Dates: 02/27/2021 through 03/27/2021 Site Technique Total Dose (Gy) Dose per Fx (Gy) Completed Fx Beam Energies  Breast, Left: Breast_L 3D 42.56/42.56 2.66 16/16 6XFFF  Breast, Left: Breast_L_Bst 3D 10/10 2.5 4/4 6X, 10X   Narrative: The patient tolerated radiation therapy relatively well. She developed anticipated skin changes in the treatment field.   Plan: The patient will receive a call in about one month from the radiation oncology department. She will continue follow up with Dr. Lindi Adie as well.   ________________________________________________    Carola Rhine, [facility]

## 2021-03-27 ENCOUNTER — Encounter: Payer: Self-pay | Admitting: Radiation Oncology

## 2021-03-27 ENCOUNTER — Other Ambulatory Visit: Payer: Self-pay

## 2021-03-27 ENCOUNTER — Ambulatory Visit
Admission: RE | Admit: 2021-03-27 | Discharge: 2021-03-27 | Disposition: A | Discharge status: Home / Self Care | Payer: Medicare Other | Source: Ambulatory Visit | Attending: Radiation Oncology | Admitting: Radiation Oncology

## 2021-03-27 DIAGNOSIS — Z510 Encounter for antineoplastic radiation therapy: Secondary | ICD-10-CM | POA: Diagnosis not present

## 2021-03-27 NOTE — Progress Notes (Signed)
Patient Care Team: Cyndi Bender, PA-C as PCP - General (Physician Assistant) Mauro Kaufmann, RN as Oncology Nurse Navigator Rockwell Germany, RN as Oncology Nurse Navigator  DIAGNOSIS:    ICD-10-CM   1. Malignant neoplasm of upper-inner quadrant of left breast in female, estrogen receptor positive ([place])  C50.212    Z17.0       SUMMARY OF ONCOLOGIC HISTORY: Oncology History  Malignant neoplasm of upper-inner quadrant of left breast in female, estrogen receptor positive ([place])  11/30/2020 Initial Diagnosis   Screening mammogram: a possible mass in the left breast. Diagnostic mammogram and [place]: highly suspicious 9 mm mass involving the UIQ of the left breast at the 11 o'clock position 6 cm from the nipple. Biopsy: Grade 2 IDC PR+(100%) ER+(95%) Her2-.   12/11/2020 Cancer Staging   Staging form: Breast, AJCC 8th Edition - Clinical stage from 12/11/2020: Stage IA (cT1b, cN0, cM0, G2, ER+, PR+, HER2-) - Signed by Nicholas Lose, MD on 12/13/2020 Stage prefix: Initial diagnosis Method of lymph node assessment: Clinical Histologic grading system: 3 grade system    02/06/2021 Oncotype testing   Oncotype DX recurrence score: 14, distant recurrence at 9 years: 4%   03/04/2021 - 03/27/2021 Radiation Therapy   Adjuvant radiation     CHIEF COMPLIANT: Follow-up of left breast cancer  INTERVAL HISTORY: Yvette Walker is a 66 y.o. with above-mentioned history of left breast cancer having undergone left lumpectomy. She presents to the clinic today for follow-up.    ALLERGIES:  has No Known Allergies.  MEDICATIONS:  Current Outpatient Medications  Medication Sig Dispense Refill   alendronate (FOSAMAX) 70 MG tablet Take 70 mg by mouth once a week.     amLODipine (NORVASC) 10 MG tablet daily.     haloperidol (HALDOL) 20 MG tablet Take by mouth.     HYDROcodone-acetaminophen (NORCO/VICODIN) 5-325 MG tablet Take 1 tablet by mouth every 6 (six) hours as needed for moderate pain or severe pain.  10 tablet 0   metoprolol succinate (TOPROL-XL) 25 MG 24 hr tablet Take 25 mg by mouth daily.     trihexyphenidyl (ARTANE) 2 MG tablet Take 2 mg by mouth daily as needed.     No current facility-administered medications for this visit.    PHYSICAL EXAMINATION: ECOG PERFORMANCE STATUS: 1 - Symptomatic but completely ambulatory  Vitals:   03/28/21 1103  BP: 107/65  Pulse: 68  Resp: 18  Temp: (!) 97.3 F (36.3 C)  SpO2: 98%   Filed Weights   03/28/21 1103  Weight: 128 lb 6.4 oz (58.2 kg)      LABORATORY DATA:  I have reviewed the data as listed CMP Latest Ref Rng & Units 01/18/2021 06/27/2010 07/12/2009  Glucose 70 - 99 mg/dL 91 81 94  BUN 8 - 23 mg/dL 10 7 8   Creatinine 0.44 - 1.00 mg/dL 1.05(H) 0.83 0.91  Sodium 135 - 145 mmol/L 142 135 140  Potassium 3.5 - 5.1 mmol/L 5.0 3.8 3.9  Chloride 98 - 111 mmol/L 105 95(L) 104  CO2 22 - 32 mmol/L 28 29 31   Calcium 8.9 - 10.3 mg/dL 10.1 10.8(H) 9.9    Lab Results  Component Value Date   WBC 10.7 (H) 07/05/2010   HGB 10.1 (L) 07/05/2010   HCT 31.6 (L) 07/05/2010   MCV 80.4 07/05/2010   PLT 278 07/05/2010    ASSESSMENT & PLAN:  Malignant neoplasm of upper-inner quadrant of left breast in female, estrogen receptor positive ([place]) 11/30/2020: Screening mammogram: a possible mass  in the left breast. Diagnostic mammogram and [place]: highly suspicious 9 mm mass involving the UIQ of the left breast at the 11 o'clock position 6 cm from the nipple. Biopsy: Grade 2 IDC PR+(100%) ER+(95%) Her2-.   Treatment summary: 1. 01/23/21: Left Lumpectomy: 1.7 cm IMC, Margins Neg, 0/2 LN Neg, Grade 2 IDC PR+(100%) ER+(95%)  Her2- 2. Oncotype DX recurrence score: 14, distant recurrence at 9 years: 4% 3. Adjuvant radiation: 03/04/2021-03/27/2021 4. Adjuvant antiestrogen therapy with letrozole to start 04/08/2021 ------------------------------------------------------------------------------------------------------------------------------- Letrozole  counseling: We discussed the risks and benefits of anti-estrogen therapy with aromatase inhibitors. These include but not limited to insomnia, hot flashes, mood changes, vaginal dryness, bone density loss, and weight gain. We strongly believe that the benefits far outweigh the risks. Patient understands these risks and consented to starting treatment. Planned treatment duration is 5-7 years.  Return to clinic in 3 months for survivorship care plan visit    No orders of the defined types were placed in this encounter.  The patient has a good understanding of the overall plan. she agrees with it. she will call with any problems that may develop before the next visit here.  Total time spent: 20 mins including face to face time and time spent for planning, charting and coordination of care  Rulon Eisenmenger, MD, MPH 03/28/2021  I, Thana Ates, am acting as scribe for Dr. Nicholas Lose.  I have reviewed the above documentation for accuracy and completeness, and I agree with the above.

## 2021-03-28 ENCOUNTER — Inpatient Hospital Stay: Payer: Medicare Other | Attending: Hematology and Oncology | Admitting: Hematology and Oncology

## 2021-03-28 DIAGNOSIS — C50212 Malignant neoplasm of upper-inner quadrant of left female breast: Secondary | ICD-10-CM

## 2021-03-28 DIAGNOSIS — Z170 Estrogen receptor positive status [ER+]: Secondary | ICD-10-CM | POA: Insufficient documentation

## 2021-03-28 MED ORDER — LETROZOLE 2.5 MG PO TABS: 2.5000 mg | ORAL_TABLET | Freq: Every day | ORAL | 3 refills | Status: DC

## 2021-03-28 NOTE — Assessment & Plan Note (Signed)
11/30/2020:Screening mammogram: a possible mass in the left breast. Diagnostic mammogram and [place]: highly suspicious 9 mm mass involving the UIQ of the left breast at the 11 o'clock position 6 cm from the nipple. Biopsy: Grade 2 IDC PR+(100%) ER+(95%) Her2-.  Treatment summary: 1. 01/23/21: Left Lumpectomy: 1.7 cm IMC, Margins Neg, 0/2 LN Neg, Grade 2 IDC PR+(100%) ER+(95%)  Her2- 2. Oncotype DX recurrence score: 14, distant recurrence at 9 years: 4% 3. Adjuvant radiation: 03/04/2021-03/27/2021 4. Adjuvant antiestrogen therapy with letrozole to start 04/08/2021 ------------------------------------------------------------------------------------------------------------------------------- Letrozole counseling: We discussed the risks and benefits of anti-estrogen therapy with aromatase inhibitors. These include but not limited to insomnia, hot flashes, mood changes, vaginal dryness, bone density loss, and weight gain. We strongly believe that the benefits far outweigh the risks. Patient understands these risks and consented to starting treatment. Planned treatment duration is 5-7 years.  Return to clinic in 3 months for survivorship care plan visit

## 2021-04-16 ENCOUNTER — Encounter (HOSPITAL_COMMUNITY): Payer: Self-pay

## 2021-04-22 ENCOUNTER — Ambulatory Visit
Admission: RE | Admit: 2021-04-22 | Discharge: 2021-04-22 | Disposition: A | Discharge status: Home / Self Care | Payer: Medicare Other | Source: Ambulatory Visit | Attending: Radiation Oncology | Admitting: Radiation Oncology

## 2021-04-22 ENCOUNTER — Other Ambulatory Visit: Payer: Self-pay

## 2021-04-22 DIAGNOSIS — C50212 Malignant neoplasm of upper-inner quadrant of left female breast: Secondary | ICD-10-CM | POA: Insufficient documentation

## 2021-04-22 DIAGNOSIS — Z170 Estrogen receptor positive status [ER+]: Secondary | ICD-10-CM

## 2021-04-22 NOTE — Progress Notes (Addendum)
?  Radiation Oncology         (336) [phone] ?________________________________ ? ?Name: LIZABETH FELLNER MRN: [id number]  ?Date of Service: 04/22/2021  DOB: Dec 05, 1955 ? ?Post Treatment Telephone Note ? ?Diagnosis:   Stage IA, pT1cN0M0 grade 2, ER/PR positive invasive ductal carcinoma of the left breast. ? ?Intent: Curative ? ?Radiation Treatment Dates: 02/27/2021 through 03/27/2021 ?Site Technique Total Dose (Gy) Dose per Fx (Gy) Completed Fx Beam Energies  ?Breast, Left: Breast_L 3D 42.56/42.56 2.66 16/16 6XFFF  ?Breast, Left: Breast_L_Bst 3D 10/10 2.5 4/4 6X, 10X  ? ?Narrative: The patient tolerated radiation therapy relatively well. She developed anticipated skin changes in the treatment field. Her skin has improved but still hyperpigmented. ? ?Impression/Plan: ?1. Stage IA, pT1cN0M0 grade 2, ER/PR positive invasive ductal carcinoma of the left breast. The patient has been doing well since completion of radiotherapy. We discussed that we would be happy to continue to follow her as needed, but she will also continue to follow up with Dr. Lindi Adie in medical oncology. She was counseled on skin care as well as measures to avoid sun exposure to this area.  ? ? ? ? ?Carola Rhine, PAC  ? ? ?  ?

## 2021-04-29 ENCOUNTER — Ambulatory Visit: Payer: Medicare Other | Attending: General Surgery

## 2021-04-29 ENCOUNTER — Other Ambulatory Visit: Payer: Self-pay

## 2021-04-29 ENCOUNTER — Ambulatory Visit: Payer: Medicare Other

## 2021-04-29 VITALS — Wt 130.4 lb

## 2021-04-29 DIAGNOSIS — Z483 Aftercare following surgery for neoplasm: Secondary | ICD-10-CM | POA: Insufficient documentation

## 2021-04-29 NOTE — Therapy (Signed)
[facility] ?[facility] ?[place], [place], [address] ?Phone: [phone]   Fax:  [phone] ? ?Physical Therapy Treatment ? ?Patient Details  ?Name: Yvette Walker ?MRN: [id number] ?Date of Birth: 12/03/1955 ?Referring Provider (PT): Dr. Marlou Starks ? ? ?Encounter Date: 04/29/2021 ? ? PT End of Session - 04/29/21 1013   ? ? Visit Number 2   # unchanged due to screen only  ? PT Start Time 1009   ? PT Stop Time 1016   ? PT Time Calculation (min) 7 min   ? Activity Tolerance Patient tolerated treatment well   ? Behavior During Therapy [facility] for tasks assessed/performed   ? ?  ?  ? ?  ? ? ?Past Medical History:  ?Diagnosis Date  ? Breast cancer ([place]) 11/30/2020  ? Hypertension   ? Hypothyroidism   ? ? ?Past Surgical History:  ?Procedure Laterality Date  ? ABDOMINAL HYSTERECTOMY    ? BREAST LUMPECTOMY WITH RADIOACTIVE SEED AND SENTINEL LYMPH NODE BIOPSY Left 01/23/2021  ? Procedure: LEFT BREAST LUMPECTOMY WITH RADIOACTIVE SEED AND SENTINEL LYMPH NODE BIOPSY;  Surgeon: Jovita Kussmaul, MD;  Location: [facility];  Service: General;  Laterality: Left;  ? TUBAL LIGATION    ? ? ?Vitals:  ? 04/29/21 1011  ?Weight: 130 lb 6 oz (59.1 kg)  ? ? ? Subjective Assessment - 04/29/21 1010   ? ? Subjective Pt returns for her 3 month L-Dex screen.   ? Pertinent History Lt lumpectomy 01/23/21 with 0/2LNs removed.  Pt had 4 weeks of radiation ending 03/27/21 and no chemo   ? ?  ?  ? ?  ? ? ? ? ? ? ? ? ? L-DEX FLOWSHEETS - 04/29/21 1000   ? ?  ? L-DEX LYMPHEDEMA SCREENING  ? Measurement Type Unilateral   ? L-DEX MEASUREMENT EXTREMITY Upper Extremity   ? POSITION  Standing   ? DOMINANT SIDE Left   ? At Risk Side Left   ? BASELINE SCORE (UNILATERAL) 3.1   ? L-DEX SCORE (UNILATERAL) 6.4   ? VALUE CHANGE (UNILAT) 3.3   ? ?  ?  ? ?  ? ? ? ? ? ? ? ? ? ? ? ? ? ? ? ? ? ? ? ? ? ? ? ? ? ? ? ? ? ? ? ? ? Plan - 04/29/21 1013   ? ? Clinical Impression Statement Pt returns for her 3 month L-Dex  screen. Her change from baseline of 3.3 is WNLs so no further treatment is required at this time except to cont every 3 month L-Dex screens which pt is agreeable to.   ? PT Next Visit Plan Cont every 3 month L-Dex screens for up to 2 years from her SLNB (~01/2023)   ? Consulted and Agree with Plan of Care Patient   ? ?  ?  ? ?  ? ? ?Patient will benefit from skilled therapeutic intervention in order to improve the following deficits and impairments:    ? ?Visit Diagnosis: ?Aftercare following surgery for neoplasm ? ? ? ? ?Problem List ?Patient Active Problem List  ? Diagnosis Date Noted  ? Malignant neoplasm of upper-inner quadrant of left breast in female, estrogen receptor positive ([place]) 12/11/2020  ? ? ?[name], PTA ?04/29/2021, 10:16 AM ? ?[facility] ?[facility] ?[place], [place], [address] ?Phone: [phone]   Fax:  [phone] ? ?Name: Yvette Walker ?MRN:  [id number] ?Date of Birth: 09/12/1955 ? ? ? ?

## 2021-06-21 ENCOUNTER — Telehealth: Payer: Self-pay | Admitting: *Deleted

## 2021-06-25 ENCOUNTER — Other Ambulatory Visit: Payer: Self-pay

## 2021-06-25 ENCOUNTER — Encounter: Payer: Self-pay | Admitting: Adult Health

## 2021-06-25 ENCOUNTER — Inpatient Hospital Stay: Payer: Medicare Other | Attending: Hematology and Oncology | Admitting: Adult Health

## 2021-06-25 VITALS — BP 118/78 | HR 71 | Temp 98.1°F | Resp 16 | Ht 62.0 in | Wt 124.1 lb

## 2021-06-25 DIAGNOSIS — Z79811 Long term (current) use of aromatase inhibitors: Secondary | ICD-10-CM | POA: Diagnosis not present

## 2021-06-25 DIAGNOSIS — Z923 Personal history of irradiation: Secondary | ICD-10-CM | POA: Insufficient documentation

## 2021-06-25 DIAGNOSIS — Z170 Estrogen receptor positive status [ER+]: Secondary | ICD-10-CM | POA: Insufficient documentation

## 2021-06-25 DIAGNOSIS — C50212 Malignant neoplasm of upper-inner quadrant of left female breast: Secondary | ICD-10-CM | POA: Diagnosis not present

## 2021-06-25 DIAGNOSIS — Z79899 Other long term (current) drug therapy: Secondary | ICD-10-CM | POA: Diagnosis not present

## 2021-06-25 DIAGNOSIS — M810 Age-related osteoporosis without current pathological fracture: Secondary | ICD-10-CM | POA: Diagnosis not present

## 2021-06-25 NOTE — Progress Notes (Signed)
SURVIVORSHIP VISIT: ? ? ?BRIEF ONCOLOGIC HISTORY:  ?Oncology History  ?Malignant neoplasm of upper-inner quadrant of left breast in female, estrogen receptor positive ([place])  ?11/30/2020 Initial Diagnosis  ? Screening mammogram: a possible mass in the left breast. Diagnostic mammogram and [place]: highly suspicious 9 mm mass involving the UIQ of the left breast at the 11 o'clock position 6 cm from the nipple. Biopsy: Grade 2 IDC PR+(100%) ER+(95%) Her2-. ?  ?12/11/2020 Cancer Staging  ? Staging form: Breast, AJCC 8th Edition ?- Clinical stage from 12/11/2020: Stage IA (cT1b, cN0, cM0, G2, ER+, PR+, HER2-) - Signed by Nicholas Lose, MD on 12/13/2020 ?Stage prefix: Initial diagnosis ?Method of lymph node assessment: Clinical ?Histologic grading system: 3 grade system ? ?  ?01/23/2021 Surgery  ? Left breast lumpectomy: gr 2 1.7cm IDC, 2SLN negative, margins negative ?  ?02/06/2021 Oncotype testing  ? Oncotype DX recurrence score: 14, distant recurrence at 9 years: 4% ?  ?03/04/2021 - 03/27/2021 Radiation Therapy  ? Site Technique Total Dose (Gy) Dose per Fx (Gy) Completed Fx Beam Energies  ?Breast, Left: Breast_L 3D 42.56/42.56 2.66 16/16 6XFFF  ?Breast, Left: Breast_L_Bst 3D 10/10 2.5 4/4 6X, 10X  ? ?  ?04/08/2021 -  Anti-estrogen oral therapy  ? Letrozole ?  ? ? ?INTERVAL HISTORY:  ?[name] to review her survivorship care plan detailing her treatment course for breast cancer, as well as monitoring long-term side effects of that treatment, education regarding health maintenance, screening, and overall wellness and health promotion.    ? ?Overall, [name] reports feeling quite well.  She is taking Letrozole daily and is tolerating it well.  She is experiencing hot flashes that occur about once per week and are manageable.  She denies arthralgias or vaginal dryness.   ? ?She underwent DEXA screening on 09/11/2020 that showed osteoporosis with t score of -2.6.  She is taking Fosamax weekly with good tolerance.  ? ?REVIEW OF  SYSTEMS:  ?Review of Systems  ?Constitutional:  Negative for appetite change, chills, fatigue, fever and unexpected weight change.  ?HENT:   Negative for hearing loss, lump/mass and trouble swallowing.   ?Eyes:  Negative for eye problems and icterus.  ?Respiratory:  Negative for chest tightness, cough and shortness of breath.   ?Cardiovascular:  Negative for chest pain, leg swelling and palpitations.  ?Gastrointestinal:  Negative for abdominal distention, abdominal pain, constipation, diarrhea, nausea and vomiting.  ?Endocrine: Negative for hot flashes.  ?Genitourinary:  Negative for difficulty urinating.   ?Musculoskeletal:  Negative for arthralgias.  ?Skin:  Negative for itching and rash.  ?Neurological:  Negative for dizziness, extremity weakness, headaches and numbness.  ?Hematological:  Negative for adenopathy. Does not bruise/bleed easily.  ?Psychiatric/Behavioral:  Negative for depression. The patient is not nervous/anxious.   ?Breast: Denies any new nodularity, masses, tenderness, nipple changes, or nipple discharge.  ? ? ? ? ?ONCOLOGY TREATMENT TEAM:  ?1. Surgeon:  Dr. Marlou Starks at [facility] ?2. Medical Oncologist: Dr. Lindi Adie  ?3. Radiation Oncologist: Dr. Lisbeth Renshaw ?  ? ?PAST MEDICAL/SURGICAL HISTORY:  ?Past Medical History:  ?Diagnosis Date  ? Breast cancer ([place]) 11/30/2020  ? Hypertension   ? Hypothyroidism   ? ?Past Surgical History:  ?Procedure Laterality Date  ? ABDOMINAL HYSTERECTOMY    ? BREAST LUMPECTOMY WITH RADIOACTIVE SEED AND SENTINEL LYMPH NODE BIOPSY Left 01/23/2021  ? Procedure: LEFT BREAST LUMPECTOMY WITH RADIOACTIVE SEED AND SENTINEL LYMPH NODE BIOPSY;  Surgeon: Jovita Kussmaul, MD;  Location: [place];  Service: General;  Laterality: Left;  ? TUBAL LIGATION    ? ? ? ?ALLERGIES:  ?No Known Allergies ? ? ?CURRENT MEDICATIONS:  ?Outpatient Encounter Medications as of 06/25/2021  ?Medication Sig Note  ? alendronate (FOSAMAX) 70 MG tablet Take 70 mg by mouth once a week.   ?  amLODipine (NORVASC) 10 MG tablet daily.   ? haloperidol (HALDOL) 20 MG tablet Take by mouth. 12/11/2020: patient requests 90 day supply  ? letrozole (FEMARA) 2.5 MG tablet Take 1 tablet (2.5 mg total) by mouth daily.   ? metoprolol succinate (TOPROL-XL) 25 MG 24 hr tablet Take 25 mg by mouth daily.   ? trihexyphenidyl (ARTANE) 2 MG tablet Take 2 mg by mouth daily as needed.   ? ?No facility-administered encounter medications on file as of 06/25/2021.  ? ? ? ?ONCOLOGIC FAMILY HISTORY:  ?Family History  ?Problem Relation Age of Onset  ? Cervical cancer Sister   ? ? ? ?SOCIAL HISTORY:  ?Social History  ? ?Socioeconomic History  ? Marital status: Married  ?  Spouse name: Not on file  ? Number of children: Not on file  ? Years of education: Not on file  ? Highest education level: Not on file  ?Occupational History  ? Not on file  ?Tobacco Use  ? Smoking status: Never  ? Smokeless tobacco: Never  ?Substance and Sexual Activity  ? Alcohol use: Never  ? Drug use: Never  ? Sexual activity: Yes  ?Other Topics Concern  ? Not on file  ?Social History Narrative  ? Not on file  ? ?Social Determinants of Health  ? ?Financial Resource Strain: Not on file  ?Food Insecurity: Not on file  ?Transportation Needs: Not on file  ?Physical Activity: Not on file  ?Stress: Not on file  ?Social Connections: Not on file  ?Intimate Partner Violence: Not At Risk  ? Fear of Current or Ex-Partner: No  ? Emotionally Abused: No  ? Physically Abused: No  ? Sexually Abused: No  ? ? ? ?OBSERVATIONS/OBJECTIVE:  ?BP 118/78 (BP Location: Left Arm)   Pulse 71   Temp 98.1 ?F (36.7 ?C) (Temporal)   Resp 16   Ht _0  (1.575 m)   Wt 124 lb 1.6 oz (56.3 kg)   SpO2 100%   BMI 22.70 kg/m?  ?GENERAL: Patient is a well appearing female in no acute distress ?HEENT:  Sclerae anicteric.  Oropharynx clear and moist. No ulcerations or evidence of oropharyngeal candidiasis. Neck is supple.  ?NODES:  No cervical, supraclavicular, or axillary lymphadenopathy  palpated.  ?BREAST EXAM:  left breast s/p lumpectomy and radiation, no sign of local recurrence, right breast benign ?LUNGS:  Clear to auscultation bilaterally.  No wheezes or rhonchi. ?HEART:  Regular rate and rhythm. No murmur appreciated. ?ABDOMEN:  Soft, nontender.  Positive, normoactive bowel sounds. No organomegaly palpated. ?MSK:  No focal spinal tenderness to palpation. Full range of motion bilaterally in the upper extremities. ?EXTREMITIES:  No peripheral edema.   ?SKIN:  Clear with no obvious rashes or skin changes. No nail dyscrasia. ?NEURO:  Nonfocal. Well oriented.  Appropriate affect. ? ? ?LABORATORY DATA:  ?None for this visit. ? ?DIAGNOSTIC IMAGING:  ?None for this visit.  ? ?  ? ?ASSESSMENT AND PLAN:  ?Ms.Yvette Walker is a pleasant 66 y.o. female with Stage IA left breast invasive ductal carcinoma, ER+/PR+/HER2-, diagnosed in 11/2020, treated with lumpectomy, adjuvant radiation therapy, and anti-estrogen therapy with Letrozole beginning in 03/2021.  She presents to the [facility]orship [facility] for our initial meeting  and routine follow-up post-completion of treatment for breast cancer.  ? ? ?1. Stage IA left breast cancer:  Ms. Salome is continuing to recover from definitive treatment for breast cancer. She will follow-up with her medical oncologist, Dr. Lindi Adie in 6 months with history and physical exam per surveillance protocol.  She will continue her anti-estrogen therapy with Letrozole. Thus far, she is tolerating the Letrozole well, with minimal side effects. She was instructed to make Dr. Lindi Adie or myself aware if she begins to experience any worsening side effects of the medication and I could see her back in clinic to help manage those side effects, as needed. Her mammogram is due 08/2021; orders placed today. Today, a comprehensive survivorship care plan and treatment summary was reviewed with the patient today detailing her breast cancer diagnosis, treatment course, potential late/long-term effects of  treatment, appropriate follow-up care with recommendations for the future, and patient education resources.  A copy of this summary, along with a letter will be sent to the patient?s primary care provider via mail/fax/I

## 2021-08-12 ENCOUNTER — Ambulatory Visit: Payer: Medicare Other | Attending: General Surgery

## 2021-08-12 VITALS — Wt 126.1 lb

## 2021-08-12 DIAGNOSIS — Z483 Aftercare following surgery for neoplasm: Secondary | ICD-10-CM | POA: Insufficient documentation

## 2021-11-25 ENCOUNTER — Ambulatory Visit: Payer: Medicare Other | Attending: General Surgery

## 2021-11-25 VITALS — Wt 124.0 lb

## 2021-11-25 DIAGNOSIS — Z483 Aftercare following surgery for neoplasm: Secondary | ICD-10-CM | POA: Insufficient documentation

## 2021-11-25 NOTE — Therapy (Signed)
  OUTPATIENT PHYSICAL THERAPY SOZO SCREENING NOTE   Patient Name: Yvette Walker MRN: [id number] DOB:1956/02/14, 66 y.o., female Today's Date: 11/25/2021  PCP: Cyndi Bender, PA-C REFERRING PROVIDER: Jovita Kussmaul, MD   PT End of Session - 11/25/21 1010     Visit Number 2   # unchanged due to screen only   PT Start Time 1010    PT Stop Time 1014    PT Time Calculation (min) 4 min    Activity Tolerance Patient tolerated treatment well    Behavior During Therapy [facility] for tasks assessed/performed             Past Medical History:  Diagnosis Date   Breast cancer ([place]) 11/30/2020   Hypertension    Hypothyroidism    Past Surgical History:  Procedure Laterality Date   ABDOMINAL HYSTERECTOMY     BREAST LUMPECTOMY WITH RADIOACTIVE SEED AND SENTINEL LYMPH NODE BIOPSY Left 01/23/2021   Procedure: LEFT BREAST LUMPECTOMY WITH RADIOACTIVE SEED AND SENTINEL LYMPH NODE BIOPSY;  Surgeon: Jovita Kussmaul, MD;  Location: [place];  Service: General;  Laterality: Left;   TUBAL LIGATION     Patient Active Problem List   Diagnosis Date Noted   Malignant neoplasm of upper-inner quadrant of left breast in female, estrogen receptor positive ([place]) 12/11/2020   Prolonged QT interval 07/28/2016   Prediabetes 07/21/2016   Hypothyroidism (acquired) 07/21/2016   Essential hypertension 07/21/2016   Schizophrenia ([place]) 07/20/2016    REFERRING DIAG: left breast cancer at risk for lymphedema  THERAPY DIAG: Aftercare following surgery for neoplasm  PERTINENT HISTORY: Lt lumpectomy 01/23/21 with 0/2LNs removed.  Pt had 4 weeks of radiation ending 03/27/21 and no chemo   PRECAUTIONS: left UE Lymphedema risk, None  SUBJECTIVE: Pt returns for her 3 month L-Dex screen.   PAIN:  Are you having pain? No  SOZO SCREENING: Patient was assessed today using the SOZO machine to determine the lymphedema index score. This was compared to her baseline score. It was determined that she is within  the recommended range when compared to her baseline and no further action is needed at this time. She will continue SOZO screenings. These are done every 3 months for 2 years post operatively followed by every 6 months for 2 years, and then annually.   L-DEX FLOWSHEETS - 11/25/21 1000       L-DEX LYMPHEDEMA SCREENING   Measurement Type Unilateral    L-DEX MEASUREMENT EXTREMITY Upper Extremity    POSITION  Standing    DOMINANT SIDE Left    At Risk Side Left    BASELINE SCORE (UNILATERAL) 3.1    L-DEX SCORE (UNILATERAL) 9.1    VALUE CHANGE (UNILAT) 6              [name], PTA 11/25/2021, 10:16 AM

## 2021-12-25 NOTE — Progress Notes (Signed)
Patient Care Team: Cyndi Bender, PA-C as PCP - General (Physician Assistant) Nicholas Lose, MD as Consulting Physician (Hematology and Oncology) Kyung Rudd, MD as Consulting Physician (Radiation Oncology) Jovita Kussmaul, MD as Consulting Physician (General Surgery)  DIAGNOSIS: No diagnosis found.  SUMMARY OF ONCOLOGIC HISTORY: Oncology History  Malignant neoplasm of upper-inner quadrant of left breast in female, estrogen receptor positive ([place])  11/30/2020 Initial Diagnosis   Screening mammogram: a possible mass in the left breast. Diagnostic mammogram and [place]: highly suspicious 9 mm mass involving the UIQ of the left breast at the 11 o'clock position 6 cm from the nipple. Biopsy: Grade 2 IDC PR+(100%) ER+(95%) Her2-.   12/11/2020 Cancer Staging   Staging form: Breast, AJCC 8th Edition - Clinical stage from 12/11/2020: Stage IA (cT1b, cN0, cM0, G2, ER+, PR+, HER2-) - Signed by Nicholas Lose, MD on 12/13/2020 Stage prefix: Initial diagnosis Method of lymph node assessment: Clinical Histologic grading system: 3 grade system   01/23/2021 Surgery   Left breast lumpectomy: gr 2 1.7cm IDC, 2SLN negative, margins negative   02/06/2021 Oncotype testing   Oncotype DX recurrence score: 14, distant recurrence at 9 years: 4%   03/04/2021 - 03/27/2021 Radiation Therapy   Site Technique Total Dose (Gy) Dose per Fx (Gy) Completed Fx Beam Energies  Breast, Left: Breast_L 3D 42.56/42.56 2.66 16/16 6XFFF  Breast, Left: Breast_L_Bst 3D 10/10 2.5 4/4 6X, 10X     04/08/2021 -  Anti-estrogen oral therapy   Letrozole     CHIEF COMPLIANT:   INTERVAL HISTORY: Yvette Walker is a   ALLERGIES:  has No Known Allergies.  MEDICATIONS:  Current Outpatient Medications  Medication Sig Dispense Refill   alendronate (FOSAMAX) 70 MG tablet Take 70 mg by mouth once a week.     amLODipine (NORVASC) 10 MG tablet daily.     haloperidol (HALDOL) 20 MG tablet Take by mouth.     letrozole (FEMARA) 2.5 MG  tablet Take 1 tablet (2.5 mg total) by mouth daily. 90 tablet 3   metoprolol succinate (TOPROL-XL) 25 MG 24 hr tablet Take 25 mg by mouth daily.     trihexyphenidyl (ARTANE) 2 MG tablet Take 2 mg by mouth daily as needed.     No current facility-administered medications for this visit.    PHYSICAL EXAMINATION: ECOG PERFORMANCE STATUS: {CHL ONC ECOG PS:[phone]}  There were no vitals filed for this visit. There were no vitals filed for this visit.  BREAST:*** No palpable masses or nodules in either right or left breasts. No palpable axillary supraclavicular or infraclavicular adenopathy no breast tenderness or nipple discharge. (exam performed in the presence of a chaperone)  LABORATORY DATA:  I have reviewed the data as listed    Latest Ref Rng & Units 01/18/2021   10:00 AM 06/27/2010    9:10 AM 07/12/2009    9:03 AM  CMP  Glucose 70 - 99 mg/dL 91  81  94   BUN 8 - 23 mg/dL _0 Creatinine 0.44 - 1.00 mg/dL 1.05  0.83  0.91   Sodium 135 - 145 mmol/L 142  135  140   Potassium 3.5 - 5.1 mmol/L 5.0  3.8  3.9   Chloride 98 - 111 mmol/L 105  95  104   CO2 22 - 32 mmol/L _1 Calcium 8.9 - 10.3 mg/dL 10.1  10.8  9.9     Lab Results  Component Value Date   WBC  10.7 (H) 07/05/2010   HGB 10.1 (L) 07/05/2010   HCT 31.6 (L) 07/05/2010   MCV 80.4 07/05/2010   PLT 278 07/05/2010    ASSESSMENT & PLAN:  No problem-specific Assessment & Plan notes found for this encounter.    No orders of the defined types were placed in this encounter.  The patient has a good understanding of the overall plan. she agrees with it. she will call with any problems that may develop before the next visit here. Total time spent: 30 mins including face to face time and time spent for planning, charting and co-ordination of care   Suzzette Righter, [place] 12/25/21    I Gardiner Coins am scribing for Dr. Lindi Adie  ***

## 2021-12-26 ENCOUNTER — Inpatient Hospital Stay: Payer: Medicare Other | Attending: Hematology and Oncology | Admitting: Hematology and Oncology

## 2021-12-26 ENCOUNTER — Other Ambulatory Visit: Payer: Self-pay

## 2021-12-26 VITALS — BP 116/77 | HR 67 | Temp 97.3°F | Resp 18 | Ht 62.0 in | Wt 125.0 lb

## 2021-12-26 DIAGNOSIS — Z170 Estrogen receptor positive status [ER+]: Secondary | ICD-10-CM

## 2021-12-26 DIAGNOSIS — C50212 Malignant neoplasm of upper-inner quadrant of left female breast: Secondary | ICD-10-CM | POA: Diagnosis not present

## 2021-12-26 DIAGNOSIS — Z79899 Other long term (current) drug therapy: Secondary | ICD-10-CM | POA: Diagnosis not present

## 2021-12-26 DIAGNOSIS — Z923 Personal history of irradiation: Secondary | ICD-10-CM | POA: Diagnosis not present

## 2021-12-26 DIAGNOSIS — Z79811 Long term (current) use of aromatase inhibitors: Secondary | ICD-10-CM | POA: Insufficient documentation

## 2021-12-26 MED ORDER — LETROZOLE 2.5 MG PO TABS: 2.5000 mg | ORAL_TABLET | Freq: Every day | ORAL | 3 refills | Status: DC

## 2021-12-26 NOTE — Assessment & Plan Note (Addendum)
11/30/2020: Screening mammogram: a possible mass in the left breast. Diagnostic mammogram and [place]: highly suspicious 9 mm mass involving the UIQ of the left breast at the 11 o'clock position 6 cm from the nipple. Biopsy: Grade 2 IDC PR+(100%) ER+(95%) Her2-.   Treatment summary: 1. 01/23/21: Left Lumpectomy: 1.7 cm IMC, Margins Neg, 0/2 LN Neg, Grade 2 IDC PR+(100%) ER+(95%)  Her2- 2. Oncotype DX recurrence score: 14, distant recurrence at 9 years: 4% 3. Adjuvant radiation: 03/04/2021-03/27/2021 4. Adjuvant antiestrogen therapy with letrozole to start 04/08/2021 ------------------------------------------------------------------------------------------------------------------------------- Letrozole toxicities: Tolerating it extremely well without any side effects or concerns.  Breast cancer surveillance: Breast exam 12/26/2021: Benign, scar tissue at the surgical scars as well as slight nodularity underneath the left nipple. Mammogram scheduled for 01/30/2022  Return to clinic in 1 year for follow-up

## 2022-02-07 ENCOUNTER — Telehealth: Payer: Self-pay | Admitting: Hematology and Oncology

## 2022-02-07 NOTE — Telephone Encounter (Signed)
Patient called to confirm future appointment time. Patient notified.

## 2022-02-24 ENCOUNTER — Ambulatory Visit: Payer: Medicare Other | Attending: General Surgery

## 2022-02-24 VITALS — Wt 123.1 lb

## 2022-02-24 DIAGNOSIS — Z483 Aftercare following surgery for neoplasm: Secondary | ICD-10-CM | POA: Insufficient documentation

## 2022-02-24 NOTE — Therapy (Signed)
  OUTPATIENT PHYSICAL THERAPY SOZO SCREENING NOTE   Patient Name: BUFORD GAYLER MRN: [id number] DOB:12/26/1955, 67 y.o., female Today's Date: 02/24/2022  PCP: Cyndi Bender, PA-C REFERRING PROVIDER: Jovita Kussmaul, MD   PT End of Session - 02/24/22 [phone]     Visit Number 2   # unchanged due to screen only   PT Start Time 0953    PT Stop Time 0957    PT Time Calculation (min) 4 min    Activity Tolerance Patient tolerated treatment well    Behavior During Therapy [facility] for tasks assessed/performed             Past Medical History:  Diagnosis Date   Breast cancer ([place]) 11/30/2020   Hypertension    Hypothyroidism    Past Surgical History:  Procedure Laterality Date   ABDOMINAL HYSTERECTOMY     BREAST LUMPECTOMY WITH RADIOACTIVE SEED AND SENTINEL LYMPH NODE BIOPSY Left 01/23/2021   Procedure: LEFT BREAST LUMPECTOMY WITH RADIOACTIVE SEED AND SENTINEL LYMPH NODE BIOPSY;  Surgeon: Jovita Kussmaul, MD;  Location: [place];  Service: General;  Laterality: Left;   TUBAL LIGATION     Patient Active Problem List   Diagnosis Date Noted   Malignant neoplasm of upper-inner quadrant of left breast in female, estrogen receptor positive ([place]) 12/11/2020   Prolonged QT interval 07/28/2016   Prediabetes 07/21/2016   Hypothyroidism (acquired) 07/21/2016   Essential hypertension 07/21/2016   Schizophrenia ([place]) 07/20/2016    REFERRING DIAG: left breast cancer at risk for lymphedema  THERAPY DIAG: Aftercare following surgery for neoplasm  PERTINENT HISTORY: Lt lumpectomy 01/23/21 with 0/2LNs removed.  Pt had 4 weeks of radiation ending 03/27/21 and no chemo   PRECAUTIONS: left UE Lymphedema risk, None  SUBJECTIVE: Pt returns for her 3 month L-Dex screen.   PAIN:  Are you having pain? No  SOZO SCREENING: Patient was assessed today using the SOZO machine to determine the lymphedema index score. This was compared to her baseline score. It was determined that she is within  the recommended range when compared to her baseline and no further action is needed at this time. She will continue SOZO screenings. These are done every 3 months for 2 years post operatively followed by every 6 months for 2 years, and then annually.   L-DEX FLOWSHEETS - 02/24/22 0900       L-DEX LYMPHEDEMA SCREENING   Measurement Type Unilateral    L-DEX MEASUREMENT EXTREMITY Upper Extremity    POSITION  Standing    DOMINANT SIDE Left    At Risk Side Left    BASELINE SCORE (UNILATERAL) 3.1    L-DEX SCORE (UNILATERAL) 0.6    VALUE CHANGE (UNILAT) -2.5              [name], PTA 02/24/2022, 9:56 AM

## 2022-03-10 DIAGNOSIS — Z170 Estrogen receptor positive status [ER+]: Secondary | ICD-10-CM | POA: Diagnosis not present

## 2022-03-10 DIAGNOSIS — C50212 Malignant neoplasm of upper-inner quadrant of left female breast: Secondary | ICD-10-CM | POA: Diagnosis not present

## 2022-03-12 NOTE — Assessment & Plan Note (Signed)
11/30/2020: Screening mammogram: a possible mass in the left breast. Diagnostic mammogram and US: highly suspicious 9 mm mass involving the UIQ of the left breast at the 11 o'clock position 6 cm from the nipple. Biopsy: Grade 2 IDC PR+(100%) ER+(95%) Her2-.   Treatment summary: 1. 01/23/21: Left Lumpectomy: 1.7 cm IMC, Margins Neg, 0/2 LN Neg, Grade 2 IDC PR+(100%) ER+(95%)  Her2- 2. Oncotype DX recurrence score: 14, distant recurrence at 9 years: 4% 3. Adjuvant radiation: 03/04/2021-03/27/2021 4. Adjuvant antiestrogen therapy with letrozole to start 04/08/2021 ------------------------------------------------------------------------------------------------------------------------------- Letrozole toxicities: Tolerating it extremely well without any side effects or concerns.  Breast cancer surveillance: Breast exam 12/26/2021: Benign, scar tissue at the surgical scars as well as slight nodularity underneath the left nipple. Mammogram scheduled for 01/30/2022  Return to clinic in 1 year for follow-up  

## 2022-03-13 ENCOUNTER — Other Ambulatory Visit: Payer: Self-pay

## 2022-03-13 ENCOUNTER — Inpatient Hospital Stay: Payer: Medicare Other | Attending: Hematology and Oncology | Admitting: Hematology and Oncology

## 2022-03-13 VITALS — BP 125/81 | HR 70 | Temp 98.1°F | Resp 15 | Wt 123.7 lb

## 2022-03-13 DIAGNOSIS — Z79811 Long term (current) use of aromatase inhibitors: Secondary | ICD-10-CM | POA: Insufficient documentation

## 2022-03-13 DIAGNOSIS — Z79899 Other long term (current) drug therapy: Secondary | ICD-10-CM | POA: Diagnosis not present

## 2022-03-13 DIAGNOSIS — C50212 Malignant neoplasm of upper-inner quadrant of left female breast: Secondary | ICD-10-CM | POA: Insufficient documentation

## 2022-03-13 DIAGNOSIS — Z923 Personal history of irradiation: Secondary | ICD-10-CM | POA: Insufficient documentation

## 2022-03-13 DIAGNOSIS — Z170 Estrogen receptor positive status [ER+]: Secondary | ICD-10-CM | POA: Insufficient documentation

## 2022-03-13 NOTE — Progress Notes (Signed)
Patient Care Team: Cyndi Bender, PA-C as PCP - General (Physician Assistant) Nicholas Lose, MD as Consulting Physician (Hematology and Oncology) Kyung Rudd, MD as Consulting Physician (Radiation Oncology) Jovita Kussmaul, MD as Consulting Physician (General Surgery)  DIAGNOSIS:  Encounter Diagnosis  Name Primary?   Malignant neoplasm of upper-inner quadrant of left breast in female, estrogen receptor positive ([place]) Yes    SUMMARY OF ONCOLOGIC HISTORY: Oncology History  Malignant neoplasm of upper-inner quadrant of left breast in female, estrogen receptor positive ([place])  11/30/2020 Initial Diagnosis   Screening mammogram: a possible mass in the left breast. Diagnostic mammogram and [place]: highly suspicious 9 mm mass involving the UIQ of the left breast at the 11 o'clock position 6 cm from the nipple. Biopsy: Grade 2 IDC PR+(100%) ER+(95%) Her2-.   12/11/2020 Cancer Staging   Staging form: Breast, AJCC 8th Edition - Clinical stage from 12/11/2020: Stage IA (cT1b, cN0, cM0, G2, ER+, PR+, HER2-) - Signed by Nicholas Lose, MD on 12/13/2020 Stage prefix: Initial diagnosis Method of lymph node assessment: Clinical Histologic grading system: 3 grade system   01/23/2021 Surgery   Left breast lumpectomy: gr 2 1.7cm IDC, 2SLN negative, margins negative   02/06/2021 Oncotype testing   Oncotype DX recurrence score: 14, distant recurrence at 9 years: 4%   03/04/2021 - 03/27/2021 Radiation Therapy   Site Technique Total Dose (Gy) Dose per Fx (Gy) Completed Fx Beam Energies  Breast, Left: Breast_L 3D 42.56/42.56 2.66 16/16 6XFFF  Breast, Left: Breast_L_Bst 3D 10/10 2.5 4/4 6X, 10X     04/08/2021 -  Anti-estrogen oral therapy   Letrozole     CHIEF COMPLIANT: Follow-up left breast cancer surveillance   INTERVAL HISTORY: Yvette Walker is a 67 y.o with the above-mentioned breast cancer surveillance currently on letrozole. She presents to the clinic for a follow-up. She reports that she is  tolerating the letrozole extremely well. She does have some mild joint stiffness, no hot flashes.   ALLERGIES:  has No Known Allergies.  MEDICATIONS:  Current Outpatient Medications  Medication Sig Dispense Refill   alendronate (FOSAMAX) 70 MG tablet Take 70 mg by mouth once a week.     amLODipine (NORVASC) 10 MG tablet daily.     haloperidol (HALDOL) 20 MG tablet Take by mouth.     letrozole (FEMARA) 2.5 MG tablet Take 1 tablet (2.5 mg total) by mouth daily. 90 tablet 3   metoprolol succinate (TOPROL-XL) 25 MG 24 hr tablet Take 25 mg by mouth daily.     trihexyphenidyl (ARTANE) 2 MG tablet Take 2 mg by mouth daily as needed.     No current facility-administered medications for this visit.    PHYSICAL EXAMINATION: ECOG PERFORMANCE STATUS: 1 - Symptomatic but completely ambulatory  Vitals:   03/13/22 1008  BP: 125/81  Pulse: 70  Resp: 15  Temp: 98.1 F (36.7 C)  SpO2: 100%   Filed Weights   03/13/22 1008  Weight: 123 lb 11.2 oz (56.1 kg)    BREAST: No palpable masses or nodules in either right or left breasts. No palpable axillary supraclavicular or infraclavicular adenopathy no breast tenderness or nipple discharge. (exam performed in the presence of a chaperone)  LABORATORY DATA:  I have reviewed the data as listed    Latest Ref Rng & Units 01/18/2021   10:00 AM 06/27/2010    9:10 AM 07/12/2009    9:03 AM  CMP  Glucose 70 - 99 mg/dL 91  81  94   BUN 8 -  23 mg/dL '10  7  8   '$ Creatinine 0.44 - 1.00 mg/dL 1.05  0.83  0.91   Sodium 135 - 145 mmol/L 142  135  140   Potassium 3.5 - 5.1 mmol/L 5.0  3.8  3.9   Chloride 98 - 111 mmol/L 105  95  104   CO2 22 - 32 mmol/L '28  29  31   '$ Calcium 8.9 - 10.3 mg/dL 10.1  10.8  9.9     Lab Results  Component Value Date   WBC 10.7 (H) 07/05/2010   HGB 10.1 (L) 07/05/2010   HCT 31.6 (L) 07/05/2010   MCV 80.4 07/05/2010   PLT 278 07/05/2010    ASSESSMENT & PLAN:  Malignant neoplasm of upper-inner quadrant of left breast in  female, estrogen receptor positive ([place]) 11/30/2020: Screening mammogram: a possible mass in the left breast. Diagnostic mammogram and [place]: highly suspicious 9 mm mass involving the UIQ of the left breast at the 11 o'clock position 6 cm from the nipple. Biopsy: Grade 2 IDC PR+(100%) ER+(95%) Her2-.   Treatment summary: 1. 01/23/21: Left Lumpectomy: 1.7 cm IMC, Margins Neg, 0/2 LN Neg, Grade 2 IDC PR+(100%) ER+(95%)  Her2- 2. Oncotype DX recurrence score: 14, distant recurrence at 9 years: 4% 3. Adjuvant radiation: 03/04/2021-03/27/2021 4. Adjuvant antiestrogen therapy with letrozole to start 04/08/2021 ------------------------------------------------------------------------------------------------------------------------------- Letrozole toxicities: Tolerating it extremely well without any side effects or concerns.   Breast cancer surveillance: Breast exam 12/26/2021: Benign, scar tissue at the surgical scars as well as slight nodularity underneath the left nipple. Mammogram scheduled for 01/30/2022   Return to clinic in 1 year for follow-up   No orders of the defined types were placed in this encounter.  The patient has a good understanding of the overall plan. she agrees with it. she will call with any problems that may develop before the next visit here. Total time spent: 30 mins including face to face time and time spent for planning, charting and co-ordination of care   Harriette Ohara, MD 03/13/22    I Gardiner Coins am acting as a Education administrator for [employer]  I have reviewed the above documentation for accuracy and completeness, and I agree with the above.

## 2022-03-20 ENCOUNTER — Ambulatory Visit
Admission: RE | Admit: 2022-03-20 | Discharge: 2022-03-20 | Disposition: A | Discharge status: Home / Self Care | Payer: Medicare Other | Source: Ambulatory Visit | Attending: Adult Health | Admitting: Adult Health

## 2022-03-20 ENCOUNTER — Other Ambulatory Visit: Payer: Self-pay | Admitting: Adult Health

## 2022-03-20 DIAGNOSIS — Z853 Personal history of malignant neoplasm of breast: Secondary | ICD-10-CM | POA: Diagnosis not present

## 2022-03-20 DIAGNOSIS — Z170 Estrogen receptor positive status [ER+]: Secondary | ICD-10-CM

## 2022-05-26 ENCOUNTER — Ambulatory Visit: Payer: Medicare Other | Attending: General Surgery

## 2022-05-26 VITALS — Wt 123.0 lb

## 2022-05-26 DIAGNOSIS — Z483 Aftercare following surgery for neoplasm: Secondary | ICD-10-CM | POA: Insufficient documentation

## 2022-05-26 NOTE — Therapy (Signed)
  OUTPATIENT PHYSICAL THERAPY SOZO SCREENING NOTE   Patient Name: Yvette Walker MRN: [id number] DOB:06-21-1955, 67 y.o., female Today's Date: 05/26/2022  PCP: Lonie Peak, PA-C REFERRING PROVIDER: Griselda Miner, MD   PT End of Session - 05/26/22 1035     Visit Number 2   # unchanged due to screen only   PT Start Time 1033    PT Stop Time 1037    PT Time Calculation (min) 4 min    Activity Tolerance Patient tolerated treatment well    Behavior During Therapy [facility] for tasks assessed/performed             Past Medical History:  Diagnosis Date   Breast cancer (HCC) 11/30/2020   Hypertension    Hypothyroidism    Past Surgical History:  Procedure Laterality Date   ABDOMINAL HYSTERECTOMY     BREAST LUMPECTOMY WITH RADIOACTIVE SEED AND SENTINEL LYMPH NODE BIOPSY Left 01/23/2021   Procedure: LEFT BREAST LUMPECTOMY WITH RADIOACTIVE SEED AND SENTINEL LYMPH NODE BIOPSY;  Surgeon: Griselda Miner, MD;  Location: [facility];  Service: General;  Laterality: Left;   TUBAL LIGATION     Patient Active Problem List   Diagnosis Date Noted   Malignant neoplasm of upper-inner quadrant of left breast in female, estrogen receptor positive 12/11/2020   Prolonged QT interval 07/28/2016   Prediabetes 07/21/2016   Hypothyroidism (acquired) 07/21/2016   Essential hypertension 07/21/2016   Schizophrenia 07/20/2016    REFERRING DIAG: left breast cancer at risk for lymphedema  THERAPY DIAG: Aftercare following surgery for neoplasm  PERTINENT HISTORY: Lt lumpectomy 01/23/21 with 0/2LNs removed.  Pt had 4 weeks of radiation ending 03/27/21 and no chemo   PRECAUTIONS: left UE Lymphedema risk, None  SUBJECTIVE: Pt returns for her 3 month L-Dex screen.   PAIN:  Are you having pain? No  SOZO SCREENING: Patient was assessed today using the SOZO machine to determine the lymphedema index score. This was compared to her baseline score. It was determined that she is within the  recommended range when compared to her baseline and no further action is needed at this time. She will continue SOZO screenings. These are done every 3 months for 2 years post operatively followed by every 6 months for 2 years, and then annually.   L-DEX FLOWSHEETS - 05/26/22 1000       L-DEX LYMPHEDEMA SCREENING   Measurement Type Unilateral    L-DEX MEASUREMENT EXTREMITY Upper Extremity    POSITION  Standing    DOMINANT SIDE Left    At Risk Side Left    BASELINE SCORE (UNILATERAL) 3.1    L-DEX SCORE (UNILATERAL) 2.9    VALUE CHANGE (UNILAT) -0.2              Hermenia Bers, PTA 05/26/2022, 10:36 AM

## 2022-06-03 DIAGNOSIS — Z23 Encounter for immunization: Secondary | ICD-10-CM | POA: Diagnosis not present

## 2022-06-03 DIAGNOSIS — Z9181 History of falling: Secondary | ICD-10-CM | POA: Diagnosis not present

## 2022-06-03 DIAGNOSIS — M810 Age-related osteoporosis without current pathological fracture: Secondary | ICD-10-CM | POA: Diagnosis not present

## 2022-06-03 DIAGNOSIS — I10 Essential (primary) hypertension: Secondary | ICD-10-CM | POA: Diagnosis not present

## 2022-06-03 DIAGNOSIS — Z139 Encounter for screening, unspecified: Secondary | ICD-10-CM | POA: Diagnosis not present

## 2022-06-20 ENCOUNTER — Other Ambulatory Visit: Payer: Self-pay | Admitting: Adult Health

## 2022-06-20 DIAGNOSIS — M7989 Other specified soft tissue disorders: Secondary | ICD-10-CM

## 2022-06-28 ENCOUNTER — Other Ambulatory Visit: Payer: Self-pay | Admitting: Adult Health

## 2022-06-28 ENCOUNTER — Ambulatory Visit
Admission: RE | Admit: 2022-06-28 | Discharge: 2022-06-28 | Disposition: A | Discharge status: Home / Self Care | Payer: Medicare Other | Source: Ambulatory Visit | Attending: Adult Health | Admitting: Adult Health

## 2022-06-28 ENCOUNTER — Other Ambulatory Visit: Payer: Medicare Other

## 2022-06-28 DIAGNOSIS — N6320 Unspecified lump in the left breast, unspecified quadrant: Secondary | ICD-10-CM

## 2022-06-28 DIAGNOSIS — M7989 Other specified soft tissue disorders: Secondary | ICD-10-CM

## 2022-06-28 DIAGNOSIS — N63 Unspecified lump in unspecified breast: Secondary | ICD-10-CM | POA: Diagnosis not present

## 2022-06-28 DIAGNOSIS — N6321 Unspecified lump in the left breast, upper outer quadrant: Secondary | ICD-10-CM | POA: Diagnosis not present

## 2022-07-02 ENCOUNTER — Other Ambulatory Visit: Payer: Medicare Other

## 2022-07-04 ENCOUNTER — Ambulatory Visit
Admission: RE | Admit: 2022-07-04 | Discharge: 2022-07-04 | Disposition: A | Discharge status: Home / Self Care | Payer: Medicare Other | Source: Ambulatory Visit | Attending: Adult Health | Admitting: Adult Health

## 2022-07-04 DIAGNOSIS — N6320 Unspecified lump in the left breast, unspecified quadrant: Secondary | ICD-10-CM

## 2022-07-04 DIAGNOSIS — N6321 Unspecified lump in the left breast, upper outer quadrant: Secondary | ICD-10-CM | POA: Diagnosis not present

## 2022-07-04 DIAGNOSIS — M7989 Other specified soft tissue disorders: Secondary | ICD-10-CM

## 2022-07-04 DIAGNOSIS — N641 Fat necrosis of breast: Secondary | ICD-10-CM | POA: Diagnosis not present

## 2022-07-04 DIAGNOSIS — N6032 Fibrosclerosis of left breast: Secondary | ICD-10-CM | POA: Diagnosis not present

## 2022-07-04 HISTORY — PX: BREAST BIOPSY: SHX20

## 2022-08-25 ENCOUNTER — Ambulatory Visit: Payer: Medicare Other | Attending: General Surgery

## 2022-08-25 VITALS — Wt 123.1 lb

## 2022-08-25 DIAGNOSIS — Z483 Aftercare following surgery for neoplasm: Secondary | ICD-10-CM | POA: Insufficient documentation

## 2022-08-25 NOTE — Therapy (Signed)
  OUTPATIENT PHYSICAL THERAPY SOZO SCREENING NOTE   Patient Name: Yvette Walker MRN: [id number] DOB:09/30/1955, 67 y.o., female Today's Date: 08/25/2022  PCP: Lonie Peak, PA-C REFERRING PROVIDER: Griselda Miner, MD   PT End of Session - 08/25/22 1035     Visit Number 2   # unchanged due to screen only   PT Start Time 1033    PT Stop Time 1037    PT Time Calculation (min) 4 min    Activity Tolerance Patient tolerated treatment well    Behavior During Therapy [facility] for tasks assessed/performed             Past Medical History:  Diagnosis Date   Breast cancer (HCC) 11/30/2020   Hypertension    Hypothyroidism    Past Surgical History:  Procedure Laterality Date   ABDOMINAL HYSTERECTOMY     BREAST BIOPSY Left 07/04/2022   [place] LT BREAST BX W LOC DEV 1ST LESION IMG BX SPEC US GUIDE 07/04/2022 [facility]   BREAST LUMPECTOMY WITH RADIOACTIVE SEED AND SENTINEL LYMPH NODE BIOPSY Left 01/23/2021   Procedure: LEFT BREAST LUMPECTOMY WITH RADIOACTIVE SEED AND SENTINEL LYMPH NODE BIOPSY;  Surgeon: Griselda Miner, MD;  Location: [facility];  Service: General;  Laterality: Left;   TUBAL LIGATION     Patient Active Problem List   Diagnosis Date Noted   Malignant neoplasm of upper-inner quadrant of left breast in female, estrogen receptor positive (HCC) 12/11/2020   Prolonged QT interval 07/28/2016   Prediabetes 07/21/2016   Hypothyroidism (acquired) 07/21/2016   Essential hypertension 07/21/2016   Schizophrenia (HCC) 07/20/2016    REFERRING DIAG: left breast cancer at risk for lymphedema  THERAPY DIAG: Aftercare following surgery for neoplasm  PERTINENT HISTORY: Lt lumpectomy 01/23/21 with 0/2LNs removed.  Pt had 4 weeks of radiation ending 03/27/21 and no chemo   PRECAUTIONS: left UE Lymphedema risk, None  SUBJECTIVE: Pt returns for her 3 month L-Dex screen.   PAIN:  Are you having pain? No  SOZO SCREENING: Patient was assessed today using the SOZO machine  to determine the lymphedema index score. This was compared to her baseline score. It was determined that she is within the recommended range when compared to her baseline and no further action is needed at this time. She will continue SOZO screenings. These are done every 3 months for 2 years post operatively followed by every 6 months for 2 years, and then annually.   L-DEX FLOWSHEETS - 08/25/22 1000       L-DEX LYMPHEDEMA SCREENING   Measurement Type Unilateral    L-DEX MEASUREMENT EXTREMITY Upper Extremity    POSITION  Standing    DOMINANT SIDE Left    At Risk Side Left    BASELINE SCORE (UNILATERAL) 3.1    L-DEX SCORE (UNILATERAL) 2.1    VALUE CHANGE (UNILAT) -1              Hermenia Bers, PTA 08/25/2022, 10:36 AM

## 2022-09-09 ENCOUNTER — Other Ambulatory Visit: Payer: Self-pay | Admitting: Hematology and Oncology

## 2022-09-11 DIAGNOSIS — C50212 Malignant neoplasm of upper-inner quadrant of left female breast: Secondary | ICD-10-CM | POA: Diagnosis not present

## 2022-09-11 DIAGNOSIS — Z170 Estrogen receptor positive status [ER+]: Secondary | ICD-10-CM | POA: Diagnosis not present

## 2022-10-27 IMAGING — MG MM BREAST LOCALIZATION CLIP
8 series · 9 of 24 positions shown · non-contrast
Comparison: Previous exam(s).

CLINICAL DATA: Patient with suspicious left breast mass status post
biopsy.

EXAM:
3D DIAGNOSTIC LEFT MAMMOGRAM POST ULTRASOUND BIOPSY

[L MLO synth-2D]
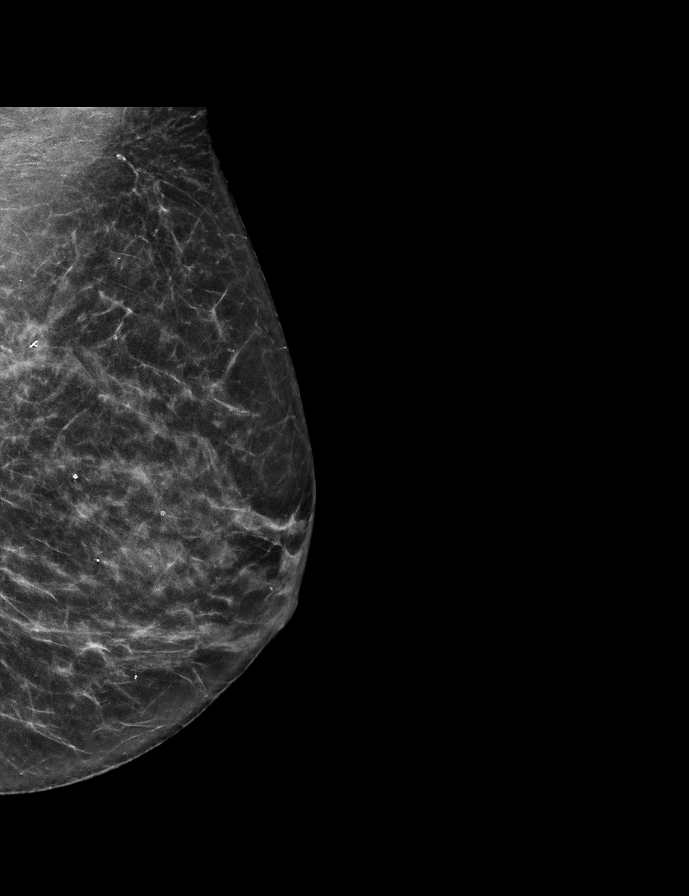

[L CC synth-2D (1 of 2)]
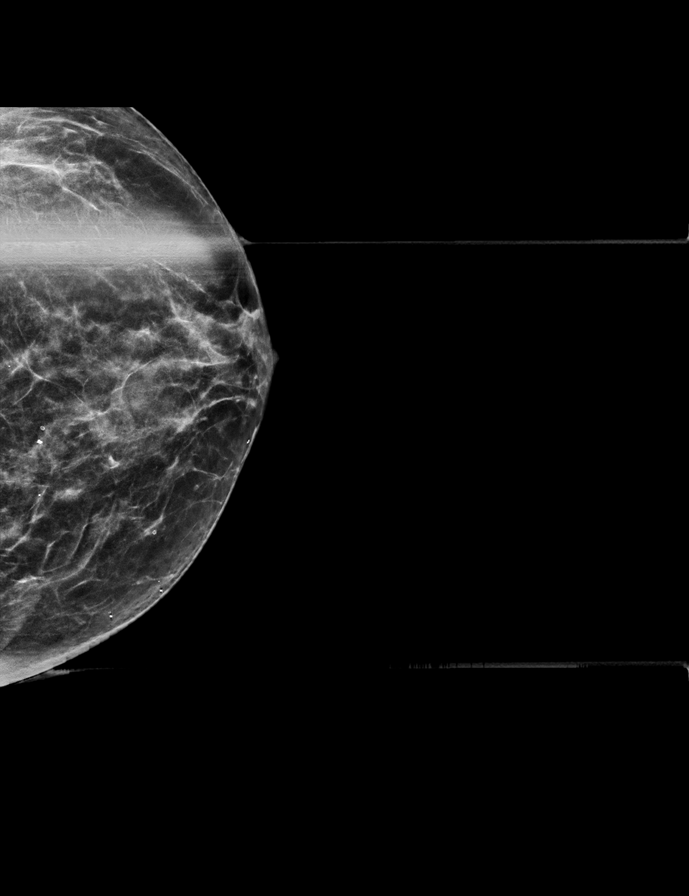

[L ML synth-2D]
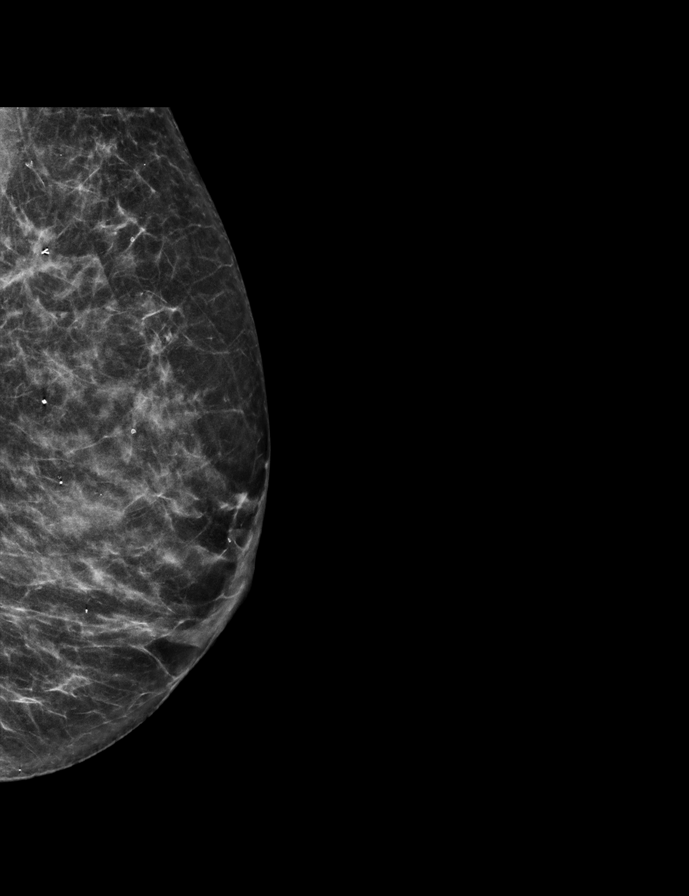

[L CC synth-2D (2 of 2)]
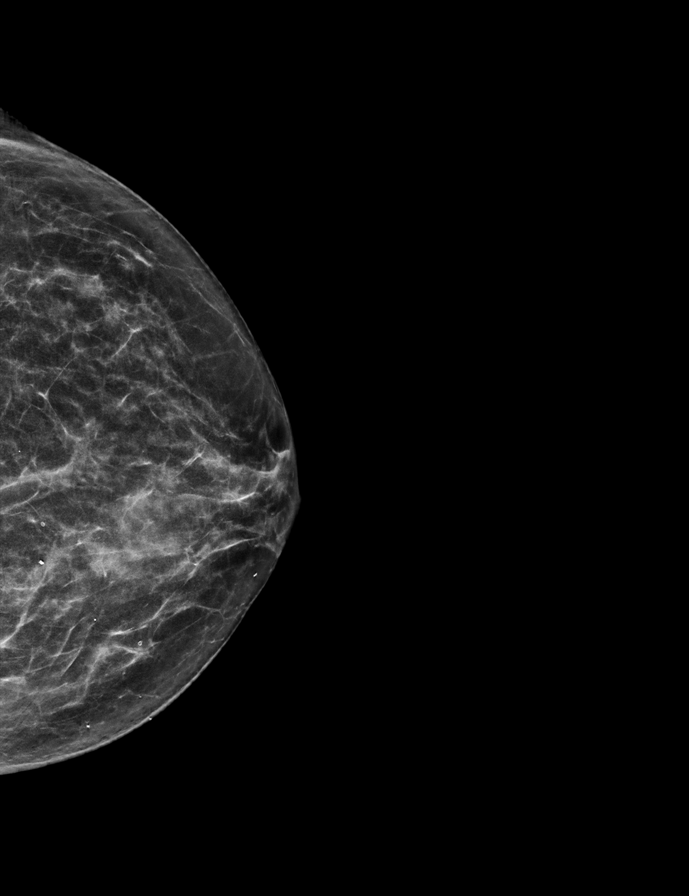

[L MLO tomo · 2 of 56 frames shown]
[frame 19/56]
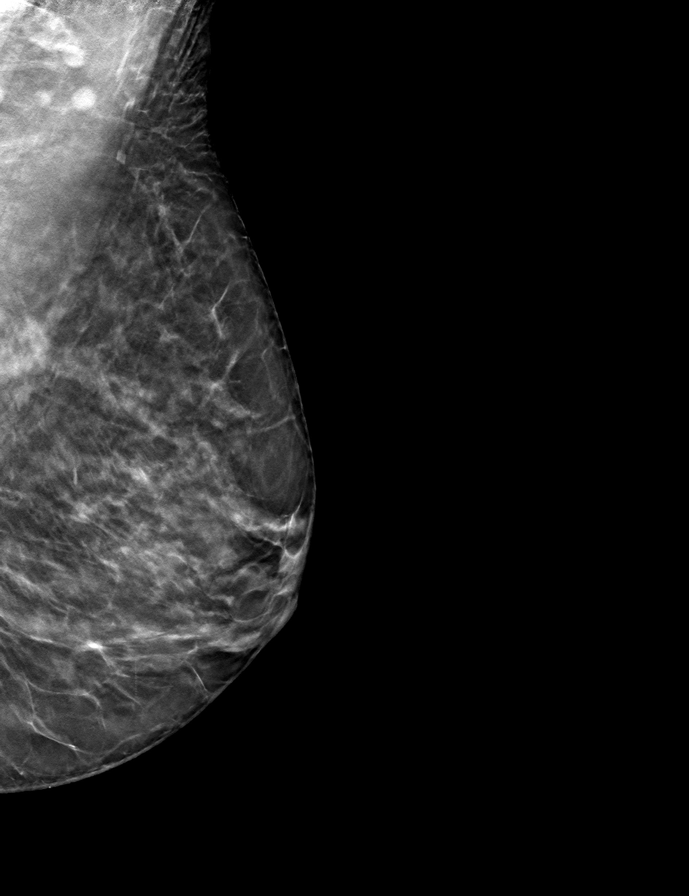
[frame 29/56]
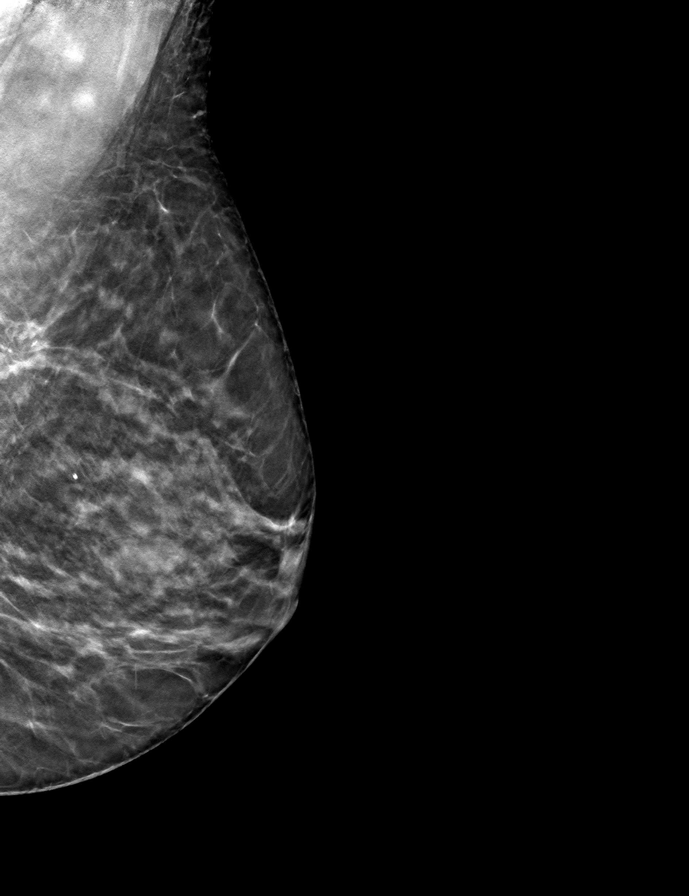

[L CC tomo (1 of 2) · tomo slice 26/51.0]
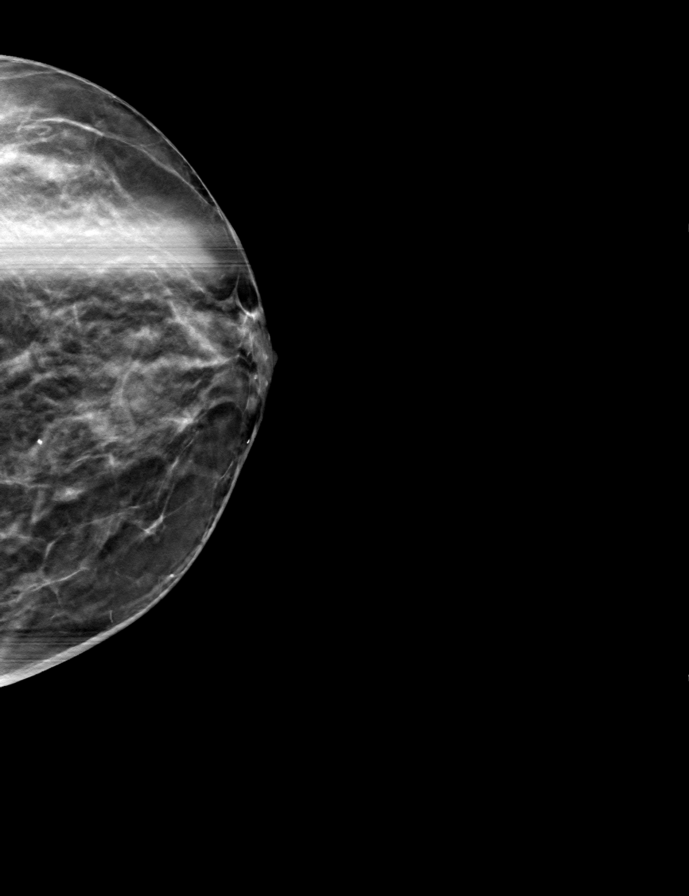

[L CC tomo (2 of 2) · tomo slice 29/56.0]
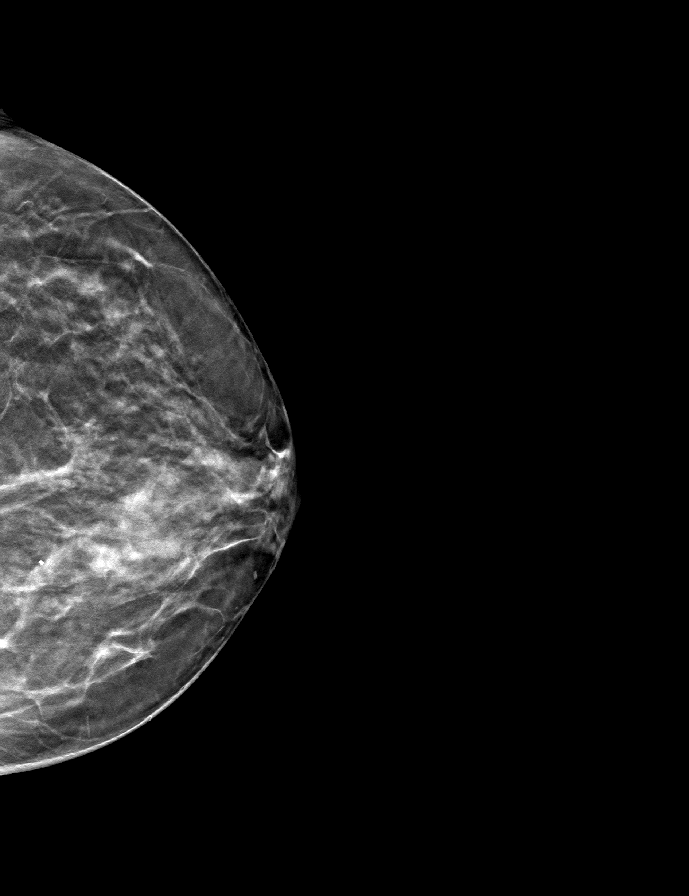

[L ML tomo · tomo slice 28/55.0]
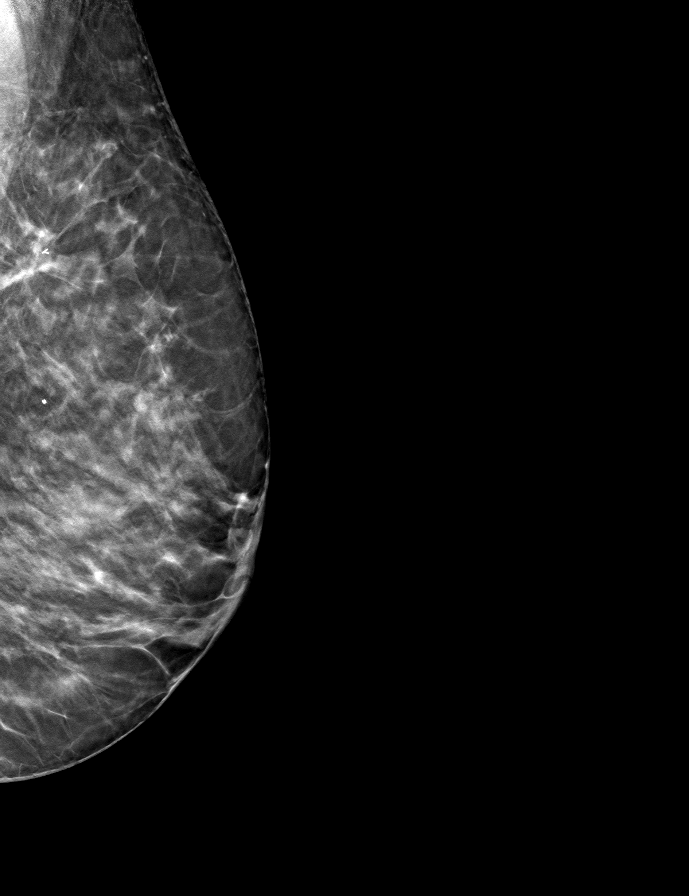

[9 of 24 positions shown; findings below may reference images not displayed]

FINDINGS: 3D Mammographic images were obtained following ultrasound guided
biopsy of left breast mass 11 o'clock position. The biopsy marking
clip is in expected position at the site of biopsy.
IMPRESSION: Appropriate positioning of the ribbon shaped biopsy marking clip at
the site of biopsy in the left breast 11 o'clock position.

Final Assessment: Post Procedure Mammograms for Marker Placement

## 2022-10-27 IMAGING — US US BREAST BX W LOC DEV 1ST LESION IMG BX SPEC US GUIDE*L*
1 series · 9 of 9 positions shown · non-contrast
Comparison: Previous exam(s).
COMPARISON: Previous exam(s).

Addendum:
CLINICAL DATA: Patient with suspicious left breast mass 11 o'clock
position.

EXAM:
ULTRASOUND GUIDED LEFT BREAST CORE NEEDLE BIOPSY

[Series 1: us breast bx w loc dev 1st lesion img bx spec us g · 0.06mm/px · 9 of 9 slices shown]
[im 1/9]
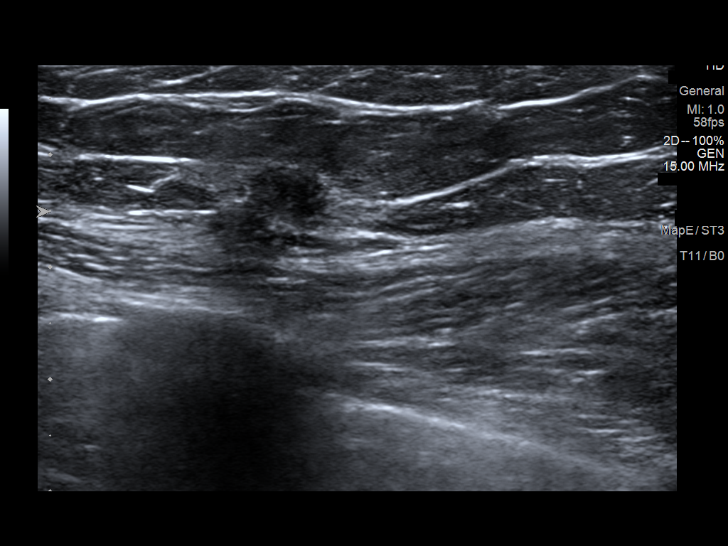
[im 2/9]
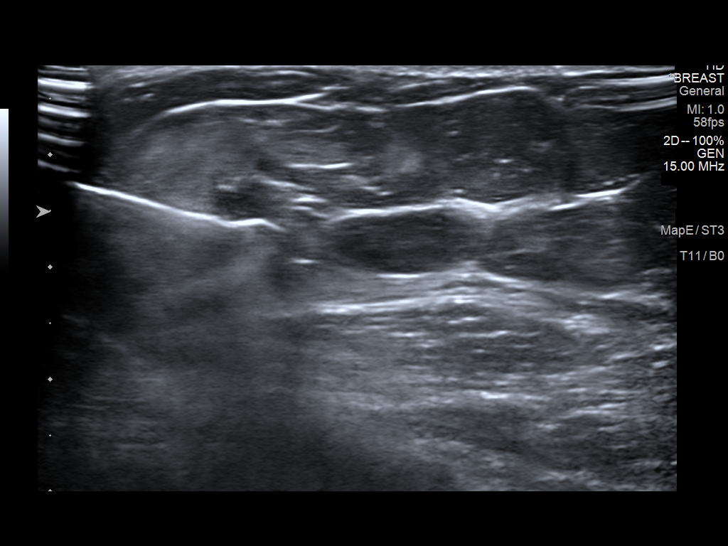
[im 3/9]
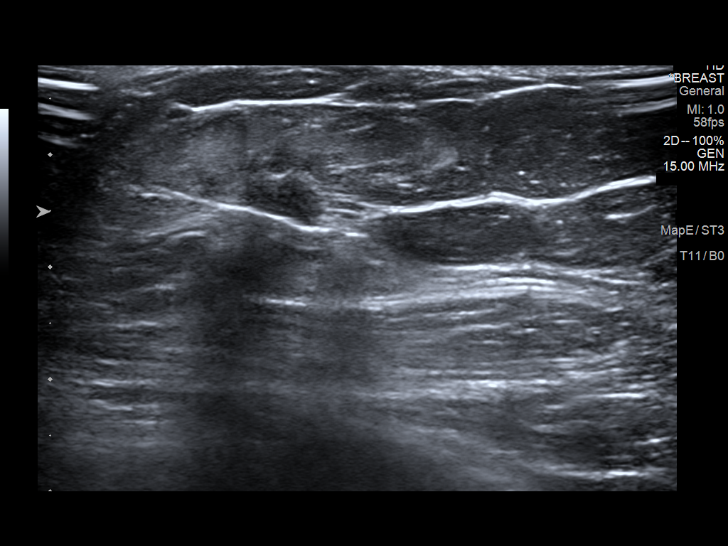
[im 4/9]
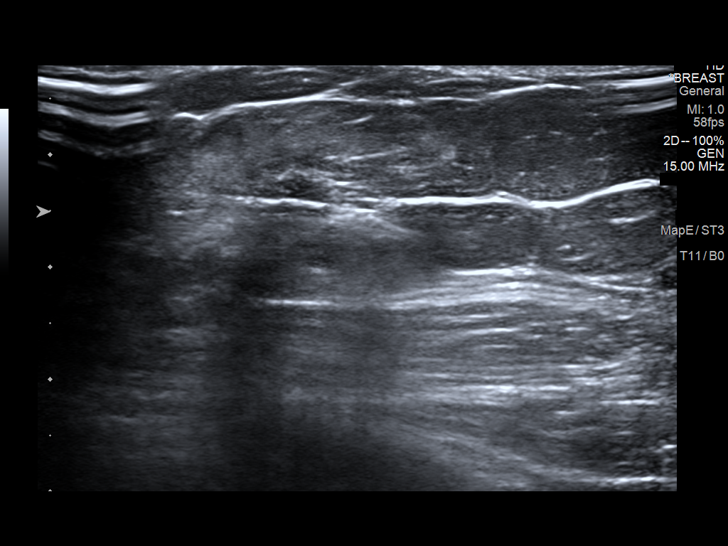
[im 5/9]
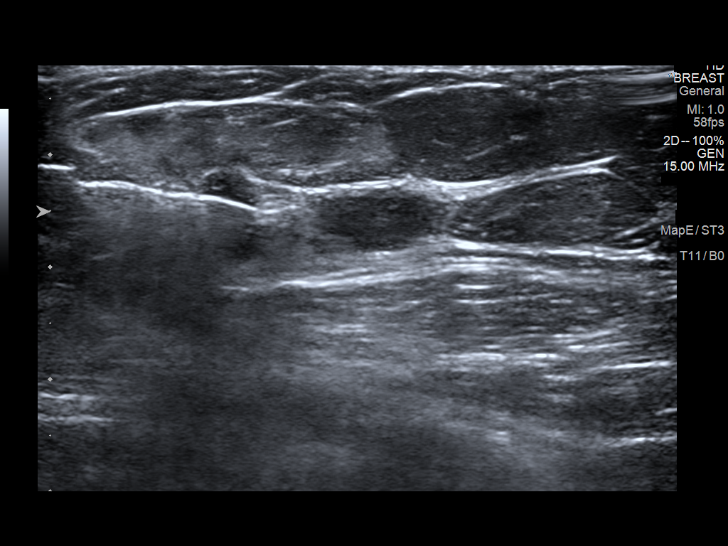
[im 6/9]
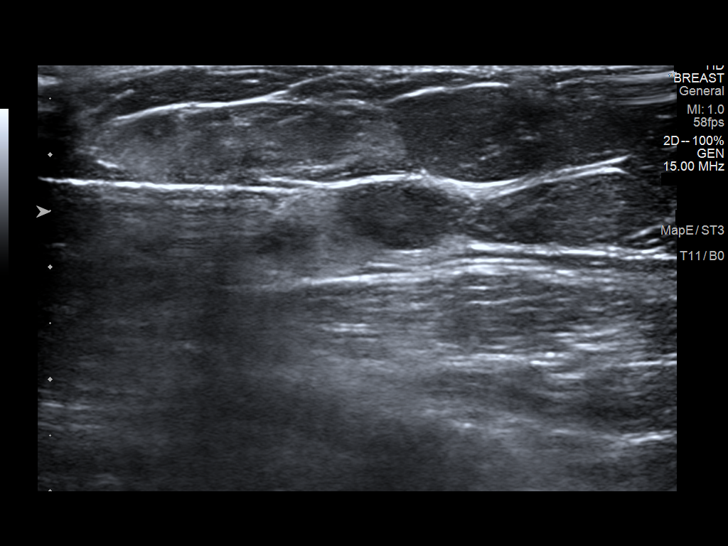
[im 7/9]
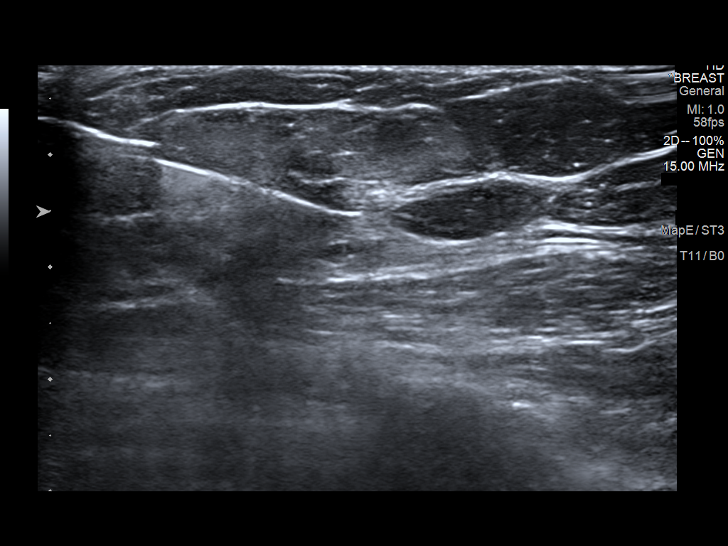
[im 8/9]
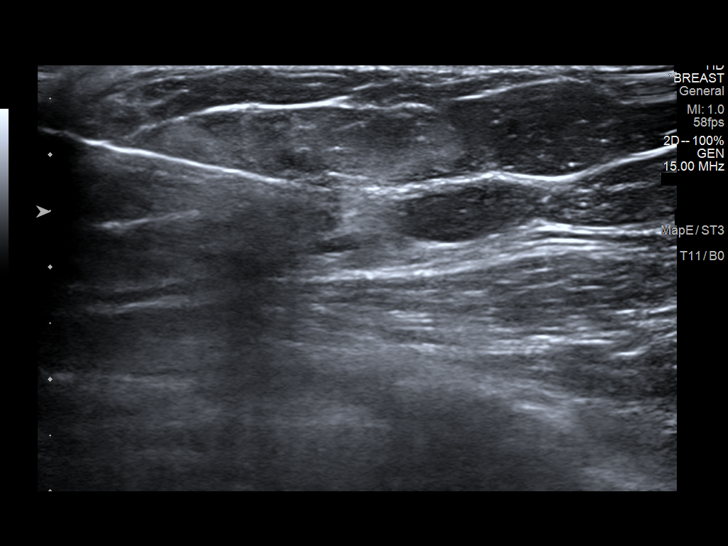
[im 9/9]
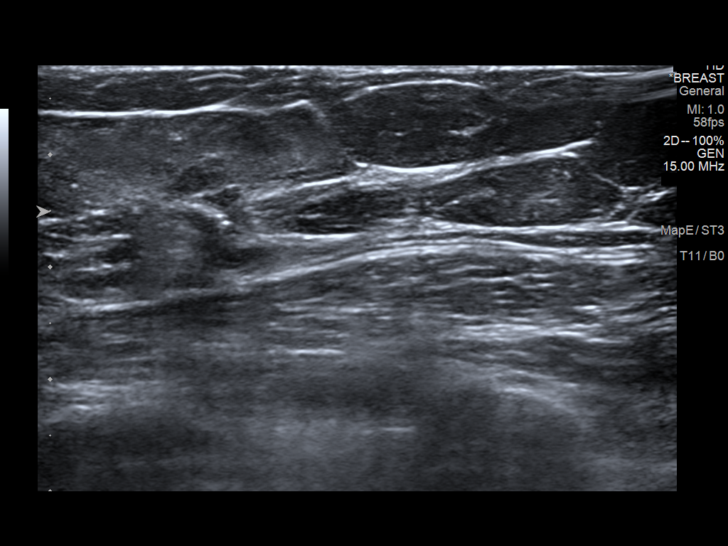

[9 of 9 positions shown; findings below may reference images not displayed]



Lesion quadrant: Upper inner quadrant

Using sterile technique and 1% Lidocaine as local anesthetic, under
direct ultrasound visualization, a 14 gauge Ablekem device was
used to perform biopsy of left breast mass 11 o'clock position using
a medial approach. At the conclusion of the procedure ribbon tissue
marker clip was deployed into the biopsy cavity. Follow up 2 view
mammogram was performed and dictated separately.
IMPRESSION: Ultrasound guided biopsy of left breast mass. No apparent
complications.

ADDENDUM:
Pathology revealed GRADE II INVASIVE MAMMARY CARCINOMA the LEFT
breast, 11:00 o'clock, ribbon clip. This was found to be concordant
by Dr. Cedrick Villafana.

Pathology results were discussed with the patient by telephone. The
patient reported doing well after the biopsy with tenderness at the
site. Post biopsy instructions and care were reviewed and questions
were answered. The patient was encouraged to call The [REDACTED]

Surgical consultation has been arranged with Dr. [place] Sosas at
[REDACTED] on December 05, 2020.

Pathology results reported by Amisu Ossy RN on 12/03/2020.



Lesion quadrant: Upper inner quadrant

Using sterile technique and 1% Lidocaine as local anesthetic, under
direct ultrasound visualization, a 14 gauge Ablekem device was
used to perform biopsy of left breast mass 11 o'clock position using
a medial approach. At the conclusion of the procedure ribbon tissue
marker clip was deployed into the biopsy cavity. Follow up 2 view
mammogram was performed and dictated separately.
IMPRESSION: Ultrasound guided biopsy of left breast mass. No apparent
complications.

## 2022-12-01 ENCOUNTER — Ambulatory Visit: Payer: Medicare Other | Attending: General Surgery

## 2022-12-01 VITALS — Wt 121.2 lb

## 2022-12-01 DIAGNOSIS — Z483 Aftercare following surgery for neoplasm: Secondary | ICD-10-CM | POA: Insufficient documentation

## 2022-12-01 NOTE — Therapy (Signed)
OUTPATIENT PHYSICAL THERAPY SOZO SCREENING NOTE   Patient Name: Yvette Walker MRN: [id number] DOB:29-Apr-1955, 67 y.o., female Today's Date: 12/01/2022  PCP: Lonie Peak, PA-C REFERRING PROVIDER: Griselda Miner, MD   PT End of Session - 12/01/22 1024     Visit Number 2   # unchanged due to screen only   PT Start Time 1022    PT Stop Time 1026    PT Time Calculation (min) 4 min    Activity Tolerance Patient tolerated treatment well    Behavior During Therapy [facility] for tasks assessed/performed             Past Medical History:  Diagnosis Date   Breast cancer (HCC) 11/30/2020   Hypertension    Hypothyroidism    Past Surgical History:  Procedure Laterality Date   ABDOMINAL HYSTERECTOMY     BREAST BIOPSY Left 07/04/2022   [place] LT BREAST BX W LOC DEV 1ST LESION IMG BX SPEC US GUIDE 07/04/2022 [facility]MO[facility]   BREAST LUMPECTOMY WITH RADIOACTIVE SEED AND SENTINEL LYMPH NODE BIOPSY Left 01/23/2021   Procedure: LEFT BREAST LUMPECTOMY WITH RADIOACTIVE SEED AND SENTINEL LYMPH NODE BIOPSY;  Surgeon: Griselda Miner, MD;  Location: [facility];  Service: General;  Laterality: Left;   TUBAL LIGATION     Patient Active Problem List   Diagnosis Date Noted   Malignant neoplasm of upper-inner quadrant of left breast in female, estrogen receptor positive (HCC) 12/11/2020   Prolonged QT interval 07/28/2016   Prediabetes 07/21/2016   Hypothyroidism (acquired) 07/21/2016   Essential hypertension 07/21/2016   Schizophrenia (HCC) 07/20/2016    REFERRING DIAG: left breast cancer at risk for lymphedema  THERAPY DIAG: Aftercare following surgery for neoplasm  PERTINENT HISTORY: Lt lumpectomy 01/23/21 with 0/2LNs removed.  Pt had 4 weeks of radiation ending 03/27/21 and no chemo   PRECAUTIONS: left UE Lymphedema risk, None  SUBJECTIVE: Pt returns for her last 3 month L-Dex screen.   PAIN:  Are you having pain? No  SOZO SCREENING: Patient was assessed today using the SOZO  machine to determine the lymphedema index score. This was compared to her baseline score. It was determined that she is within the recommended range when compared to her baseline and no further action is needed at this time. She will continue SOZO screenings. These are done every 3 months for 2 years post operatively followed by every 6 months for 2 years, and then annually.   L-DEX FLOWSHEETS - 12/01/22 1000       L-DEX LYMPHEDEMA SCREENING   Measurement Type Unilateral    L-DEX MEASUREMENT EXTREMITY Upper Extremity    POSITION  Standing    DOMINANT SIDE Left    At Risk Side Left    BASELINE SCORE (UNILATERAL) 3.1    L-DEX SCORE (UNILATERAL) 5.1    VALUE CHANGE (UNILAT) 2            P: Begin 6 month L-Dex screens next.   Hermenia Bers, PTA 12/01/2022, 10:26 AM

## 2022-12-09 DIAGNOSIS — Z23 Encounter for immunization: Secondary | ICD-10-CM | POA: Diagnosis not present

## 2022-12-09 DIAGNOSIS — M810 Age-related osteoporosis without current pathological fracture: Secondary | ICD-10-CM | POA: Diagnosis not present

## 2022-12-09 DIAGNOSIS — Z8639 Personal history of other endocrine, nutritional and metabolic disease: Secondary | ICD-10-CM | POA: Diagnosis not present

## 2022-12-09 DIAGNOSIS — C50912 Malignant neoplasm of unspecified site of left female breast: Secondary | ICD-10-CM | POA: Diagnosis not present

## 2022-12-09 DIAGNOSIS — I10 Essential (primary) hypertension: Secondary | ICD-10-CM | POA: Diagnosis not present

## 2022-12-10 ENCOUNTER — Encounter: Payer: Self-pay | Admitting: Physician Assistant

## 2022-12-10 ENCOUNTER — Other Ambulatory Visit: Payer: Self-pay | Admitting: Physician Assistant

## 2022-12-10 DIAGNOSIS — M810 Age-related osteoporosis without current pathological fracture: Secondary | ICD-10-CM

## 2023-01-19 ENCOUNTER — Other Ambulatory Visit: Payer: Self-pay | Admitting: Physician Assistant

## 2023-01-19 DIAGNOSIS — Z853 Personal history of malignant neoplasm of breast: Secondary | ICD-10-CM

## 2023-01-19 DIAGNOSIS — N6320 Unspecified lump in the left breast, unspecified quadrant: Secondary | ICD-10-CM

## 2023-03-10 DIAGNOSIS — C50212 Malignant neoplasm of upper-inner quadrant of left female breast: Secondary | ICD-10-CM | POA: Diagnosis not present

## 2023-03-10 DIAGNOSIS — Z170 Estrogen receptor positive status [ER+]: Secondary | ICD-10-CM | POA: Diagnosis not present

## 2023-03-16 ENCOUNTER — Inpatient Hospital Stay: Payer: Medicare Other | Attending: Hematology and Oncology | Admitting: Hematology and Oncology

## 2023-03-16 VITALS — BP 138/78 | HR 83 | Temp 97.2°F | Resp 18 | Ht 62.0 in | Wt 129.4 lb

## 2023-03-16 DIAGNOSIS — Z79899 Other long term (current) drug therapy: Secondary | ICD-10-CM | POA: Diagnosis not present

## 2023-03-16 DIAGNOSIS — Z79811 Long term (current) use of aromatase inhibitors: Secondary | ICD-10-CM | POA: Insufficient documentation

## 2023-03-16 DIAGNOSIS — C50212 Malignant neoplasm of upper-inner quadrant of left female breast: Secondary | ICD-10-CM

## 2023-03-16 DIAGNOSIS — Z170 Estrogen receptor positive status [ER+]: Secondary | ICD-10-CM | POA: Diagnosis not present

## 2023-03-16 DIAGNOSIS — Z1721 Progesterone receptor positive status: Secondary | ICD-10-CM | POA: Insufficient documentation

## 2023-03-16 DIAGNOSIS — Z1732 Human epidermal growth factor receptor 2 negative status: Secondary | ICD-10-CM | POA: Insufficient documentation

## 2023-03-16 NOTE — Assessment & Plan Note (Signed)
11/30/2020: Screening mammogram: a possible mass in the left breast. Diagnostic mammogram and [place]: highly suspicious 9 mm mass involving the UIQ of the left breast at the 11 o'clock position 6 cm from the nipple. Biopsy: Grade 2 IDC PR+(100%) ER+(95%) Her2-.   Treatment summary: 1. 01/23/21: Left Lumpectomy: 1.7 cm IMC, Margins Neg, 0/2 LN Neg, Grade 2 IDC PR+(100%) ER+(95%)  Her2- 2. Oncotype DX recurrence score: 14, distant recurrence at 9 years: 4% 3. Adjuvant radiation: 03/04/2021-03/27/2021 4. Adjuvant antiestrogen therapy with letrozole to start 04/08/2021 ------------------------------------------------------------------------------------------------------------------------------- Letrozole toxicities: Tolerating it extremely well without any side effects or concerns.   Breast cancer surveillance: Breast exam 03/16/2023: Benign, scar tissue at the surgical scars as well as slight nodularity underneath the left nipple. Mammogram scheduled for 03/23/2023   Return to clinic in 1 year for follow-up

## 2023-03-16 NOTE — Progress Notes (Signed)
Patient Care Team: Lonie Peak, PA-C as PCP - General (Physician Assistant) Serena Croissant, MD as Consulting Physician (Hematology and Oncology) Dorothy Puffer, MD as Consulting Physician (Radiation Oncology) Griselda Miner, MD as Consulting Physician (General Surgery)  DIAGNOSIS:  Encounter Diagnosis  Name Primary?   Malignant neoplasm of upper-inner quadrant of left breast in female, estrogen receptor positive (HCC) Yes    SUMMARY OF ONCOLOGIC HISTORY: Oncology History  Malignant neoplasm of upper-inner quadrant of left breast in female, estrogen receptor positive (H[facility])  11/30/2020 Initial Diagnosis   Screening mammogram: a possible mass in the left breast. Diagnostic mammogram and [place]: highly suspicious 9 mm mass involving the UIQ of the left breast at the 11 o'clock position 6 cm from the nipple. Biopsy: Grade 2 IDC PR+(100%) ER+(95%) Her2-.   12/11/2020 Cancer Staging   Staging form: Breast, AJCC 8th Edition - Clinical stage from 12/11/2020: Stage IA (cT1b, cN0, cM0, G2, ER+, PR+, HER2-) - Signed by Serena Croissant, MD on 12/13/2020 Stage prefix: Initial diagnosis Method of lymph node assessment: Clinical Histologic grading system: 3 grade system   01/23/2021 Surgery   Left breast lumpectomy: gr 2 1.7cm IDC, 2SLN negative, margins negative   02/06/2021 Oncotype testing   Oncotype DX recurrence score: 14, distant recurrence at 9 years: 4%   03/04/2021 - 03/27/2021 Radiation Therapy   Site Technique Total Dose (Gy) Dose per Fx (Gy) Completed Fx Beam Energies  Breast, Left: Breast_L 3D 42.56/42.56 2.66 16/16 6XFFF  Breast, Left: Breast_L_Bst 3D 10/10 2.5 4/4 6X, 10X     04/08/2021 -  Anti-estrogen oral therapy   Letrozole     CHIEF COMPLIANT:   HISTORY OF PRESENT ILLNESS: Discussed the use of AI scribe software for clinical note transcription with the patient, who gave verbal consent to proceed.  History of Present Illness   Yvette Walker, a post-operative patient two years  status post breast surgery, has been on Letrozole for prevention without any reported side effects. She denies experiencing any hot flashes. A mammogram is scheduled for the following month. She reports no discomfort or pain in the breast but mentions soreness under the left arm. She has been engaging in regular exercise, specifically walking around her house. She also mentions a persistent knot under the nipple.         ALLERGIES:  has no known allergies.  MEDICATIONS:  Current Outpatient Medications  Medication Sig Dispense Refill   alendronate (FOSAMAX) 70 MG tablet Take 70 mg by mouth once a week.     amLODipine (NORVASC) 10 MG tablet daily.     haloperidol (HALDOL) 20 MG tablet Take by mouth.     letrozole (FEMARA) 2.5 MG tablet TAKE 1 TABLET BY MOUTH EVERY DAY 90 tablet 3   metoprolol succinate (TOPROL-XL) 25 MG 24 hr tablet Take 25 mg by mouth daily.     trihexyphenidyl (ARTANE) 2 MG tablet Take 2 mg by mouth daily as needed.     No current facility-administered medications for this visit.    PHYSICAL EXAMINATION: ECOG PERFORMANCE STATUS: 1 - Symptomatic but completely ambulatory  Vitals:   03/16/23 0952  BP: 138/78  Pulse: 83  Resp: 18  Temp: (!) 97.2 F (36.2 C)  SpO2: 100%   Filed Weights   03/16/23 0952  Weight: 129 lb 6.4 oz (58.7 kg)    Physical Exam   BREAST: No abnormalities noted, good healing from surgery, small knot under nipple present, reported as longstanding. MUSCULOSKELETAL: Soreness under the left arm.      (  exam performed in the presence of a chaperone)  LABORATORY DATA:  I have reviewed the data as listed    Latest Ref Rng & Units 01/18/2021   10:00 AM 06/27/2010    9:10 AM 07/12/2009    9:03 AM  CMP  Glucose 70 - 99 mg/dL 91  81  94   BUN 8 - 23 mg/dL 10  7  8    Creatinine 0.44 - 1.00 mg/dL 6.44  0.34  7.42   Sodium 135 - 145 mmol/L 142  135  140   Potassium 3.5 - 5.1 mmol/L 5.0  3.8  3.9   Chloride 98 - 111 mmol/L 105  95  104   CO2 22  - 32 mmol/L 28  29  31    Calcium 8.9 - 10.3 mg/dL 59.5  63.8  9.9     Lab Results  Component Value Date   WBC 10.7 (H) 07/05/2010   HGB 10.1 (L) 07/05/2010   HCT 31.6 (L) 07/05/2010   MCV 80.4 07/05/2010   PLT 278 07/05/2010    ASSESSMENT & PLAN:  Malignant neoplasm of upper-inner quadrant of left breast in female, estrogen receptor positive (HCC) 11/30/2020: Screening mammogram: a possible mass in the left breast. Diagnostic mammogram and [place]: highly suspicious 9 mm mass involving the UIQ of the left breast at the 11 o'clock position 6 cm from the nipple. Biopsy: Grade 2 IDC PR+(100%) ER+(95%) Her2-.   Treatment summary: 1. 01/23/21: Left Lumpectomy: 1.7 cm IMC, Margins Neg, 0/2 LN Neg, Grade 2 IDC PR+(100%) ER+(95%)  Her2- 2. Oncotype DX recurrence score: 14, distant recurrence at 9 years: 4% 3. Adjuvant radiation: 03/04/2021-03/27/2021 4. Adjuvant antiestrogen therapy with letrozole started 04/08/2021 ------------------------------------------------------------------------------------------------------------------------------- Letrozole toxicities: Tolerating it extremely well without any side effects or concerns.   Breast cancer surveillance: Breast exam 03/16/2023: Benign, scar tissue at the surgical scars as well as slight nodularity underneath the left nipple. Mammogram scheduled for 03/23/2023   Return to clinic in 1 year for follow-up     No orders of the defined types were placed in this encounter.  The patient has a good understanding of the overall plan. she agrees with it. she will call with any problems that may develop before the next visit here. Total time spent: 30 mins including face to face time and time spent for planning, charting and co-ordination of care   Tamsen Meek, MD 03/16/23

## 2023-03-23 ENCOUNTER — Ambulatory Visit
Admission: RE | Admit: 2023-03-23 | Discharge: 2023-03-23 | Disposition: A | Discharge status: Home / Self Care | Payer: Medicare Other | Source: Ambulatory Visit | Attending: Physician Assistant | Admitting: Physician Assistant

## 2023-03-23 DIAGNOSIS — N6320 Unspecified lump in the left breast, unspecified quadrant: Secondary | ICD-10-CM

## 2023-03-23 DIAGNOSIS — Z853 Personal history of malignant neoplasm of breast: Secondary | ICD-10-CM

## 2023-06-01 ENCOUNTER — Ambulatory Visit: Payer: Medicare Other | Attending: General Surgery

## 2023-06-01 VITALS — Wt 122.2 lb

## 2023-06-01 DIAGNOSIS — Z483 Aftercare following surgery for neoplasm: Secondary | ICD-10-CM | POA: Insufficient documentation

## 2023-06-01 NOTE — Therapy (Signed)
  OUTPATIENT PHYSICAL THERAPY SOZO SCREENING NOTE   Patient Name: Yvette Walker MRN: [id number] DOB:20-May-1955, 68 y.o., female Today's Date: 06/01/2023  PCP: Aloha Arnold, PA-C REFERRING PROVIDER: Caralyn Chandler, MD   PT End of Session - 06/01/23 1030     Visit Number 2   # unchanged due to screen only   PT Start Time 1027    PT Stop Time 1032    PT Time Calculation (min) 5 min    Activity Tolerance Patient tolerated treatment well    Behavior During Therapy [facility] for tasks assessed/performed             Past Medical History:  Diagnosis Date   Breast cancer (HCC) 11/30/2020   Hypertension    Hypothyroidism    Past Surgical History:  Procedure Laterality Date   ABDOMINAL HYSTERECTOMY     BREAST BIOPSY Left 07/04/2022   US  LT BREAST BX W LOC DEV 1ST LESION IMG BX SPEC US  GUIDE 07/04/2022 [facility]   BREAST LUMPECTOMY WITH RADIOACTIVE SEED AND SENTINEL LYMPH NODE BIOPSY Left 01/23/2021   Procedure: LEFT BREAST LUMPECTOMY WITH RADIOACTIVE SEED AND SENTINEL LYMPH NODE BIOPSY;  Surgeon: Caralyn Chandler, MD;  Location: [facility];  Service: General;  Laterality: Left;   TUBAL LIGATION     Patient Active Problem List   Diagnosis Date Noted   Malignant neoplasm of upper-inner quadrant of left breast in female, estrogen receptor positive (HCC) 12/11/2020   Prolonged QT interval 07/28/2016   Prediabetes 07/21/2016   Hypothyroidism (acquired) 07/21/2016   Essential hypertension 07/21/2016   Schizophrenia (HCC) 07/20/2016    REFERRING DIAG: left breast cancer at risk for lymphedema  THERAPY DIAG: Aftercare following surgery for neoplasm  PERTINENT HISTORY: Lt lumpectomy 01/23/21 with 0/2LNs removed.  Pt had 4 weeks of radiation ending 03/27/21 and no chemo   PRECAUTIONS: left UE Lymphedema risk, None  SUBJECTIVE: Pt returns for her first 6 month L-Dex screen.   PAIN:  Are you having pain? No  SOZO SCREENING: Patient was assessed today using the SOZO  machine to determine the lymphedema index score. This was compared to her baseline score. It was determined that she is within the recommended range when compared to her baseline and no further action is needed at this time. She will continue SOZO screenings. These are done every 3 months for 2 years post operatively followed by every 6 months for 2 years, and then annually.   L-DEX FLOWSHEETS - 06/01/23 1000       L-DEX LYMPHEDEMA SCREENING   Measurement Type Unilateral    L-DEX MEASUREMENT EXTREMITY Upper Extremity    POSITION  Standing    DOMINANT SIDE Left    At Risk Side Left    BASELINE SCORE (UNILATERAL) 3.1    L-DEX SCORE (UNILATERAL) 3.9    VALUE CHANGE (UNILAT) 0.8            P: Cont 6 month L-Dex screens next.   Denyce Flank, PTA 06/01/2023, 10:31 AM

## 2023-06-11 DIAGNOSIS — C50912 Malignant neoplasm of unspecified site of left female breast: Secondary | ICD-10-CM | POA: Diagnosis not present

## 2023-06-11 DIAGNOSIS — I10 Essential (primary) hypertension: Secondary | ICD-10-CM | POA: Diagnosis not present

## 2023-06-11 DIAGNOSIS — Z8639 Personal history of other endocrine, nutritional and metabolic disease: Secondary | ICD-10-CM | POA: Diagnosis not present

## 2023-06-11 DIAGNOSIS — M810 Age-related osteoporosis without current pathological fracture: Secondary | ICD-10-CM | POA: Diagnosis not present

## 2023-06-11 DIAGNOSIS — Z9181 History of falling: Secondary | ICD-10-CM | POA: Diagnosis not present

## 2023-06-11 DIAGNOSIS — Z139 Encounter for screening, unspecified: Secondary | ICD-10-CM | POA: Diagnosis not present

## 2023-07-20 ENCOUNTER — Ambulatory Visit
Admission: RE | Admit: 2023-07-20 | Discharge: 2023-07-20 | Disposition: A | Discharge status: Home / Self Care | Payer: Medicare Other | Source: Ambulatory Visit | Attending: Physician Assistant | Admitting: Physician Assistant

## 2023-07-20 DIAGNOSIS — M810 Age-related osteoporosis without current pathological fracture: Secondary | ICD-10-CM

## 2023-07-20 DIAGNOSIS — M8588 Other specified disorders of bone density and structure, other site: Secondary | ICD-10-CM | POA: Diagnosis not present

## 2023-08-22 ENCOUNTER — Other Ambulatory Visit: Payer: Self-pay | Admitting: Hematology and Oncology

## 2023-09-29 DIAGNOSIS — C50212 Malignant neoplasm of upper-inner quadrant of left female breast: Secondary | ICD-10-CM | POA: Diagnosis not present

## 2023-09-29 DIAGNOSIS — Z170 Estrogen receptor positive status [ER+]: Secondary | ICD-10-CM | POA: Diagnosis not present

## 2023-11-30 ENCOUNTER — Ambulatory Visit: Attending: General Surgery

## 2023-11-30 VITALS — Wt 121.0 lb

## 2023-11-30 DIAGNOSIS — Z483 Aftercare following surgery for neoplasm: Secondary | ICD-10-CM | POA: Insufficient documentation

## 2023-11-30 NOTE — Therapy (Signed)
  OUTPATIENT PHYSICAL THERAPY SOZO SCREENING NOTE   Patient Name: Yvette Walker MRN: [id number] DOB:[birth year], 68 y.o., female Today's Date: 11/30/2023  PCP: Montey Lot, PA-C REFERRING PROVIDER: Curvin Deward MOULD, MD   PT End of Session - 11/30/23 1023     Visit Number 2   # unchanged due to screen only   PT Start Time 1021    PT Stop Time 1025    PT Time Calculation (min) 4 min    Activity Tolerance Patient tolerated treatment well    Behavior During Therapy [facility] for tasks assessed/performed          Past Medical History:  Diagnosis Date   Breast cancer (HCC) 11/30/2020   Hypertension    Hypothyroidism    Past Surgical History:  Procedure Laterality Date   ABDOMINAL HYSTERECTOMY     BREAST BIOPSY Left 07/04/2022   US  LT BREAST BX W LOC DEV 1ST LESION IMG BX SPEC US  GUIDE 07/04/2022 [facility]   BREAST LUMPECTOMY WITH RADIOACTIVE SEED AND SENTINEL LYMPH NODE BIOPSY Left 01/23/2021   Procedure: LEFT BREAST LUMPECTOMY WITH RADIOACTIVE SEED AND SENTINEL LYMPH NODE BIOPSY;  Surgeon: Curvin Deward MOULD, MD;  Location: [facility];  Service: General;  Laterality: Left;   TUBAL LIGATION     Patient Active Problem List   Diagnosis Date Noted   Malignant neoplasm of upper-inner quadrant of left breast in female, estrogen receptor positive (HCC) 12/11/2020   Prolonged QT interval 07/28/2016   Prediabetes 07/21/2016   Hypothyroidism (acquired) 07/21/2016   Essential hypertension 07/21/2016   Schizophrenia (HCC) 07/20/2016    REFERRING DIAG: left breast cancer at risk for lymphedema  THERAPY DIAG: Aftercare following surgery for neoplasm  PERTINENT HISTORY: Lt lumpectomy 01/23/21 with 0/2LNs removed.  Pt had 4 weeks of radiation ending 03/27/21 and no chemo   PRECAUTIONS: left UE Lymphedema risk, None  SUBJECTIVE: Pt returns for her 6 month L-Dex screen.   PAIN:  Are you having pain? No  SOZO SCREENING: Patient was assessed today using the SOZO machine to  determine the lymphedema index score. This was compared to her baseline score. It was determined that she is within the recommended range when compared to her baseline and no further action is needed at this time. She will continue SOZO screenings. These are done every 3 months for 2 years post operatively followed by every 6 months for 2 years, and then annually.   L-DEX FLOWSHEETS - 11/30/23 1000       L-DEX LYMPHEDEMA SCREENING   Measurement Type Unilateral    L-DEX MEASUREMENT EXTREMITY Upper Extremity    POSITION  Standing    DOMINANT SIDE Left    At Risk Side Left    BASELINE SCORE (UNILATERAL) 3.1    L-DEX SCORE (UNILATERAL) 5.1    VALUE CHANGE (UNILAT) 2         P: Cont 6 month L-Dex screens.   Aden Berwyn Caldron, PTA 11/30/2023, 10:24 AM

## 2023-12-11 DIAGNOSIS — C50912 Malignant neoplasm of unspecified site of left female breast: Secondary | ICD-10-CM | POA: Diagnosis not present

## 2023-12-11 DIAGNOSIS — L989 Disorder of the skin and subcutaneous tissue, unspecified: Secondary | ICD-10-CM | POA: Diagnosis not present

## 2023-12-11 DIAGNOSIS — Z9181 History of falling: Secondary | ICD-10-CM | POA: Diagnosis not present

## 2023-12-11 DIAGNOSIS — Z139 Encounter for screening, unspecified: Secondary | ICD-10-CM | POA: Diagnosis not present

## 2023-12-11 DIAGNOSIS — M810 Age-related osteoporosis without current pathological fracture: Secondary | ICD-10-CM | POA: Diagnosis not present

## 2023-12-11 DIAGNOSIS — Z23 Encounter for immunization: Secondary | ICD-10-CM | POA: Diagnosis not present

## 2023-12-11 DIAGNOSIS — Z8639 Personal history of other endocrine, nutritional and metabolic disease: Secondary | ICD-10-CM | POA: Diagnosis not present

## 2023-12-11 DIAGNOSIS — I10 Essential (primary) hypertension: Secondary | ICD-10-CM | POA: Diagnosis not present

## 2024-02-16 ENCOUNTER — Other Ambulatory Visit: Payer: Self-pay | Admitting: Physician Assistant

## 2024-02-16 DIAGNOSIS — Z1231 Encounter for screening mammogram for malignant neoplasm of breast: Secondary | ICD-10-CM

## 2024-03-16 ENCOUNTER — Inpatient Hospital Stay: Payer: Medicare Other | Attending: Hematology and Oncology | Admitting: Hematology and Oncology

## 2024-03-16 VITALS — BP 117/71 | HR 80 | Temp 97.9°F | Resp 17 | Ht 62.0 in | Wt 120.6 lb

## 2024-03-16 DIAGNOSIS — Z170 Estrogen receptor positive status [ER+]: Secondary | ICD-10-CM | POA: Diagnosis not present

## 2024-03-16 DIAGNOSIS — M8580 Other specified disorders of bone density and structure, unspecified site: Secondary | ICD-10-CM | POA: Insufficient documentation

## 2024-03-16 DIAGNOSIS — Z79899 Other long term (current) drug therapy: Secondary | ICD-10-CM | POA: Diagnosis not present

## 2024-03-16 DIAGNOSIS — Z1721 Progesterone receptor positive status: Secondary | ICD-10-CM | POA: Diagnosis not present

## 2024-03-16 DIAGNOSIS — Z1732 Human epidermal growth factor receptor 2 negative status: Secondary | ICD-10-CM | POA: Insufficient documentation

## 2024-03-16 DIAGNOSIS — C50212 Malignant neoplasm of upper-inner quadrant of left female breast: Secondary | ICD-10-CM | POA: Diagnosis present

## 2024-03-16 NOTE — Assessment & Plan Note (Signed)
 11/30/2020: Screening mammogram: a possible mass in the left breast. Diagnostic mammogram and US : highly suspicious 9 mm mass involving the UIQ of the left breast at the 11 o'clock position 6 cm from the nipple. Biopsy: Grade 2 IDC PR+(100%) ER+(95%) Her2-.   Treatment summary: 1. 01/23/21: Left Lumpectomy: 1.7 cm IMC, Margins Neg, 0/2 LN Neg, Grade 2 IDC PR+(100%) ER+(95%)  Her2- 2. Oncotype DX recurrence score: 14, distant recurrence at 9 years: 4% 3. Adjuvant radiation: 03/04/2021-03/27/2021 4. Adjuvant antiestrogen therapy with letrozole  started 04/08/2021 ------------------------------------------------------------------------------------------------------------------------------- Letrozole  toxicities: Tolerating it extremely well without any side effects or concerns.   Breast cancer surveillance: Breast exam 03/16/2024: Benign, scar tissue at the surgical scars as well as slight nodularity underneath the left nipple. Mammogram 03/23/2023: Benign breast density category C Bone density 07/20/2023: T-score -2.4: Osteopenia (no significant change) recommended bisphosphonate therapy along with calcium and vitamin D and weightbearing exercises   Return to clinic in 1 year for follow-up

## 2024-03-16 NOTE — Progress Notes (Signed)
 "  Patient Care Team: Montey Lot, PA-C as PCP - General (Physician Assistant) Odean Potts, MD as Consulting Physician (Hematology and Oncology) Dewey Rush, MD as Consulting Physician (Radiation Oncology) Curvin Deward MOULD, MD as Consulting Physician (General Surgery)  DIAGNOSIS:  Encounter Diagnosis  Name Primary?   Malignant neoplasm of upper-inner quadrant of left breast in female, estrogen receptor positive (HCC) Yes    SUMMARY OF ONCOLOGIC HISTORY: Oncology History  Malignant neoplasm of upper-inner quadrant of left breast in female, estrogen receptor positive (HCC)  11/30/2020 Initial Diagnosis   Screening mammogram: a possible mass in the left breast. Diagnostic mammogram and US : highly suspicious 9 mm mass involving the UIQ of the left breast at the 11 o'clock position 6 cm from the nipple. Biopsy: Grade 2 IDC PR+(100%) ER+(95%) Her2-.   12/11/2020 Cancer Staging   Staging form: Breast, AJCC 8th Edition - Clinical stage from 12/11/2020: Stage IA (cT1b, cN0, cM0, G2, ER+, PR+, HER2-) - Signed by Odean Potts, MD on 12/13/2020 Stage prefix: Initial diagnosis Method of lymph node assessment: Clinical Histologic grading system: 3 grade system   01/23/2021 Surgery   Left breast lumpectomy: gr 2 1.7cm IDC, 2SLN negative, margins negative   02/06/2021 Oncotype testing   Oncotype DX recurrence score: 14, distant recurrence at 9 years: 4%   03/04/2021 - 03/27/2021 Radiation Therapy   Site Technique Total Dose (Gy) Dose per Fx (Gy) Completed Fx Beam Energies  Breast, Left: Breast_L 3D 42.56/42.56 2.66 16/16 6XFFF  Breast, Left: Breast_L_Bst 3D 10/10 2.5 4/4 6X, 10X     04/08/2021 -  Anti-estrogen oral therapy   Letrozole      CHIEF COMPLIANT:   HISTORY OF PRESENT ILLNESS: Discussed the use of AI scribe software for clinical note transcription with the patient, who gave verbal consent to proceed.  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  She remains on adjuvant letrozole  without difficulty and denies new symptoms, including breast changes, chest complaints, or hot flashes. She has not noticed any new breast issues since her last visit. Her next surveillance mammogram is scheduled for February 4.  She was recently diagnosed with osteopenia on bone density testing and is taking alendronate. She has not yet discussed the bone density results with her primary care provider but denies new bone pain or other bone-related symptoms.  Mar 16, 2023: Follow-up for ER+ left breast IDC post-lumpectomy and adjuvant radiation; patient on letrozole  with excellent tolerance and no side effects. No breast pain, but mild soreness under left arm and longstanding nodularity under left nipple, exam benign with scar tissue. Mammogram scheduled for 03/23/2023; plan for annual follow-up.     ALLERGIES:  has no known allergies.  MEDICATIONS:  Current Outpatient Medications  Medication Sig Dispense Refill   alendronate (FOSAMAX) 70 MG tablet Take 70 mg by mouth once a week.     amLODipine (NORVASC) 10 MG tablet daily.     haloperidol (HALDOL) 20 MG tablet Take by mouth.     letrozole  (FEMARA ) 2.5 MG tablet TAKE 1 TABLET BY MOUTH EVERY DAY 60 tablet 5   metoprolol succinate (TOPROL-XL) 25 MG 24 hr tablet Take 25 mg by mouth daily.     trihexyphenidyl (ARTANE) 2 MG tablet Take 2 mg by mouth daily as needed.     No current facility-administered medications for this visit.    PHYSICAL  EXAMINATION: ECOG PERFORMANCE STATUS: 1 - Symptomatic but completely ambulatory  Vitals:   03/16/24 0955  BP: 117/71  Pulse: 80  Resp: 17  Temp: 97.9 F (36.6 C)  SpO2: 95%   Filed Weights   03/16/24 0955  Weight: 120 lb 9.6 oz (54.7 kg)    Physical Exam   (exam performed in the presence of a  chaperone)  LABORATORY DATA:  I have reviewed the data as listed    Latest Ref Rng & Units 01/18/2021   10:00 AM 06/27/2010    9:10 AM 07/12/2009    9:03 AM  CMP  Glucose 70 - 99 mg/dL 91  81  94   BUN 8 - 23 mg/dL 10  7  8    Creatinine 0.44 - 1.00 mg/dL 8.94  9.16  9.08   Sodium 135 - 145 mmol/L 142  135  140   Potassium 3.5 - 5.1 mmol/L 5.0  3.8  3.9   Chloride 98 - 111 mmol/L 105  95  104   CO2 22 - 32 mmol/L 28  29  31    Calcium 8.9 - 10.3 mg/dL 89.8  89.1  9.9     Lab Results  Component Value Date   WBC 10.7 (H) 07/05/2010   HGB 10.1 (L) 07/05/2010   HCT 31.6 (L) 07/05/2010   MCV 80.4 07/05/2010   PLT 278 07/05/2010    ASSESSMENT & PLAN:  Malignant neoplasm of upper-inner quadrant of left breast in female, estrogen receptor positive (HCC) 11/30/2020: Screening mammogram: a possible mass in the left breast. Diagnostic mammogram and US : highly suspicious 9 mm mass involving the UIQ of the left breast at the 11 o'clock position 6 cm from the nipple. Biopsy: Grade 2 IDC PR+(100%) ER+(95%) Her2-.   Treatment summary: 1. 01/23/21: Left Lumpectomy: 1.7 cm IMC, Margins Neg, 0/2 LN Neg, Grade 2 IDC PR+(100%) ER+(95%)  Her2- 2. Oncotype DX recurrence score: 14, distant recurrence at 9 years: 4% 3. Adjuvant radiation: 03/04/2021-03/27/2021 4. Adjuvant antiestrogen therapy with letrozole  started 04/08/2021 ------------------------------------------------------------------------------------------------------------------------------- Letrozole  toxicities: Tolerating it extremely well without any side effects or concerns.   Breast cancer surveillance: Breast exam 03/16/2024: Benign, scar tissue at the surgical scars as well as slight nodularity underneath the left nipple. Mammogram 03/23/2023: Benign breast density category C Bone density 07/20/2023: T-score -2.4: Osteopenia (no significant change) recommended bisphosphonate therapy along with calcium and vitamin D and weightbearing exercises    Return to clinic in 1 year for follow-up   ------------------------------------- Assessment and Plan Assessment & Plan Estrogen receptor positive malignant neoplasm of upper-inner quadrant of left breast Three years post-resection. Tolerates letrozole  well. No new symptoms. Surveillance and endocrine therapy ongoing. - Continued letrozole  with monitoring for adverse effects. - Scheduled annual surveillance mammography.  Osteopenia Osteopenia due to age. Bone density stable on alendronate. - Continued alendronate.      No orders of the defined types were placed in this encounter.  The patient has a good understanding of the overall plan. she agrees with it. she will call with any problems that may develop before the next visit here.  I personally spent a total of 30 minutes in the care of the patient today including preparing to see the patient, getting/reviewing separately obtained history, performing a medically appropriate exam/evaluation, counseling and educating, placing orders, referring and communicating with other health care professionals, documenting clinical information in the EHR, independently interpreting results, communicating results, and coordinating care.   Dr.Arlynn Mcdermid 03/16/24    "

## 2024-03-23 ENCOUNTER — Ambulatory Visit
Admission: RE | Admit: 2024-03-23 | Discharge: 2024-03-23 | Disposition: A | Source: Ambulatory Visit | Attending: Physician Assistant | Admitting: Physician Assistant

## 2024-03-23 DIAGNOSIS — Z1231 Encounter for screening mammogram for malignant neoplasm of breast: Secondary | ICD-10-CM

## 2024-05-30 ENCOUNTER — Ambulatory Visit

## 2024-07-25 ENCOUNTER — Ambulatory Visit: Admitting: Physician Assistant

## 2025-03-16 ENCOUNTER — Inpatient Hospital Stay: Admitting: Hematology and Oncology
